# Patient Record
Sex: Male | Born: 1950 | Race: White | Hispanic: No | Marital: Married | Smoking: Former smoker
Health system: Southern US, Community
[De-identification: ages and names within clinical notes are randomized; demographics above are authoritative.]

## PROBLEM LIST (undated history)

## (undated) DIAGNOSIS — I340 Nonrheumatic mitral (valve) insufficiency: Secondary | ICD-10-CM

## (undated) DIAGNOSIS — IMO0002 Reserved for concepts with insufficient information to code with codable children: Secondary | ICD-10-CM

## (undated) DIAGNOSIS — R9430 Abnormal result of cardiovascular function study, unspecified: Secondary | ICD-10-CM

## (undated) DIAGNOSIS — I10 Essential (primary) hypertension: Secondary | ICD-10-CM

## (undated) DIAGNOSIS — R9431 Abnormal electrocardiogram [ECG] [EKG]: Secondary | ICD-10-CM

## (undated) DIAGNOSIS — R6889 Other general symptoms and signs: Secondary | ICD-10-CM

## (undated) DIAGNOSIS — C61 Malignant neoplasm of prostate: Secondary | ICD-10-CM

## (undated) DIAGNOSIS — N200 Calculus of kidney: Secondary | ICD-10-CM

## (undated) DIAGNOSIS — T8859XA Other complications of anesthesia, initial encounter: Secondary | ICD-10-CM

## (undated) HISTORY — DX: Abnormal result of cardiovascular function study, unspecified: R94.30

## (undated) HISTORY — DX: Abnormal electrocardiogram (ECG) (EKG): R94.31

## (undated) HISTORY — DX: Other general symptoms and signs: R68.89

## (undated) HISTORY — PX: PROSTATE BIOPSY: SHX241

## (undated) HISTORY — DX: Nonrheumatic mitral (valve) insufficiency: I34.0

## (undated) HISTORY — PX: BACK SURGERY: SHX140

## (undated) HISTORY — DX: Reserved for concepts with insufficient information to code with codable children: IMO0002

---

## 2001-10-04 ENCOUNTER — Ambulatory Visit (HOSPITAL_COMMUNITY): Admission: RE | Admit: 2001-10-04 | Discharge: 2001-10-04 | Payer: Self-pay | Admitting: Gastroenterology

## 2008-03-14 ENCOUNTER — Inpatient Hospital Stay (HOSPITAL_COMMUNITY): Admission: EM | Admit: 2008-03-14 | Discharge: 2008-03-15 | Payer: Self-pay | Admitting: Emergency Medicine

## 2011-04-11 NOTE — Discharge Summary (Signed)
NAME:  Brandon Goodman, Brandon Goodman              ACCOUNT NO.:  [id number]   MEDICAL RECORD NO.:  [id number]          PATIENT TYPE:  INP   LOCATION:  3007                         FACILITY:  [facility]   PHYSICIAN:  Marlan Palau, M.D.  DATE OF BIRTH:  01-Aug-1951   DATE OF ADMISSION:  03/14/2008  DATE OF DISCHARGE:  03/15/2008                               DISCHARGE SUMMARY   ADMISSION DIAGNOSIS:  Probable transient global amnesia.   DISCHARGE DIAGNOSIS:  Probable transient global amnesia.   PROCEDURE:  During admission:  1. CT of the head.  2. MRI of the brain.  3. MR angiogram.   COMPLICATION:  Above procedure is none.   HISTORY OF PRESENT ILLNESS:  Brandon Goodman is a 60 year old white male  born on 22-Aug-1951, with a history of lumbosacral spinal  stenosis.  The patient has otherwise been in very good health, works out  on a regular basis.  The patient was working in the garden on the day of  admission and has no recollection of events from around 10:00 a.m. to  about 3:00 p.m..  Wife became concerned when he did not recall the  upcoming wedding of their daughter within the next week.  The patient,  however, resolved by time.  He got to the emergency room, actually drove  to the ER.  The CT scan of the brain done was unremarkable.  The patient  was admitted with a diagnosis of probable transient global amnesia.  The  patient was fully functional during the amnestic period.   PAST MEDICAL HISTORY:  Significant for:  1. Transient episode of amnesia likely transient global amnesia.  2. History of lumbar spinal stenosis, status post surgery in 1998.   MEDICATIONS ON ADMISSION:  None.   The patient has no known allergies and does drink alcohol on occasion,  does not smoke cigarettes but used to 30 years ago.  Please refer to  history and physical, dictation summary, social history, family history,  review of systems, and physical examination.   HOSPITAL COURSE:  This patient has done  well during the course of  hospitalization.  This patient has had no memory deficits since the  admission.  Blood work was done on admission included a white count of  9.1, hemoglobin of 16.6, hematocrit of 48.0, MCV of 90.6, platelets of  147, and INR 0.9.  Sodium 141, potassium 4.0, chloride of 106, CO2 28,  glucose of 91, BUN 20, and creatinine 1.6.  Repeat creatinine 1.25,  total bilirubin 0.4, alk phosphatase 51, SGOT 20, SGPT of 16, total  protein 5.8, and albumin of 3.3.  CK-MB fraction 2.29 and troponin I of  less than 0.05.  Urine drug screen negative.  Urinalysis reveals  specific gravity of 1.025 and pH of 6.0.  The patient has been placed on  low-dose aspirin since admission.  The patient has been given fluid  hydration.  The patient was also placed on low-dose Zocor 20 mg daily.  The patient set up for an MRI scan of the brain with MRI angiogram,  which is pending at this time.  If this is unremarkable, the patient  will be discharged to home today and  will follow up with [facility] in 4-6 weeks.  If a  recurring event is noted in EEG, study will be obtained.  The patient  again is to remain on the low-dose aspirin.  At the time of discharge,  the patient is alert, cooperative, and at normal baseline.  Patient has  no focal neurologic deficits.      Marlan Palau, M.D.  Electronically Signed     CKW/MEDQ  D:  03/15/2008  T:  03/15/2008  Job:  [id number]   cc:   [facility]

## 2011-04-11 NOTE — H&P (Signed)
NAME:  Brandon Goodman, WINOGRAD NO.:  [id number]   MEDICAL RECORD NO.:  [id number]          PATIENT TYPE:  INP   LOCATION:  1823                         FACILITY:  [facility]   PHYSICIAN:  Suzzette Righter, DO        DATE OF BIRTH:  1951/02/03   DATE OF ADMISSION:  03/14/2008  DATE OF DISCHARGE:                              HISTORY & PHYSICAL   HISTORY OF PRESENT ILLNESS:  The patient is a 60 year old Caucasian male  seen in neurological assessment in the emergency department at [facility] for transient confusion.  The patient is accompanied by  his wife, who aids in providing the history.  Apparently, the patient  awoke this morning in his normal state.  He was able to eat breakfast  without any difficulty.  At approximately 9:30 in the morning, he went  to his yard to do some gardening.  He was joined by his wife at  approximately 10:30.  At that time between 10:30 and 2:30 p.m., the  patient was somewhat confused about typical events that he would be  aware of including the upcoming wedding of his daughter.  The patient  was able to continue working in the garden.  There were no physical  signs of illness or pain.  There was no loss of consciousness.  There  was no bowel or bladder incontinence.  There was no tonic-clonic  activity suggestive of a seizure.  At approximately 2:30, when his  symptoms persisted, the patient's wife became concerned.  Later that  afternoon, the patient and his wife drove to the hospital for  assessment.  The patient was able to drive the car without any  difficulty.  Upon arrival in the emergency department, he was at  baseline.  A CT scan of the brain without IV contrast was performed at  that time, which was essentially unremarkable.  The patient had routine  laboratory assessment performed as well as serial blood pressure  measurements, which were essentially unremarkable as well.   PAST MEDICAL HISTORY:  Includes lumbar spinal stenosis,  status post  lumbar surgery in 1998.  Patient also with an elevated total cholesterol  as an outpatient of 210 recently.   SOCIAL HISTORY:  The patient has a remote history of tobacco consumption  approximately 30 years ago.  He does consume 2-3 glasses of wine per  week.  He works as a Copywriter, advertising.  He is independent of activities  of daily living and does not use any assistive devices for ambulation.  He is married and resides with his wife.   FAMILY HISTORY:  The patient is with 2 healthy daughters.  His father is  deceased, status post CABG in his 53s and development of Parkinson  disease in his later age.   ALLERGIES:  No known drug allergies.   MEDICATIONS:  None.   REVIEW OF SYSTEMS:  At present time, the patient denies any numbness,  tingling, weakness, confusion, visual disturbance, chest pain, shortness  of breath, abdominal pain, nausea, or vomiting.  He is confirmed to be  back at baseline according  to his wife.   PHYSICAL EXAMINATION:  GENERAL: The patient is awake and alert.  He is  pleasant and cooperative.  He is in no acute distress and does not  appear to be toxic.  VITAL SIGNS: Temperature 98.3, blood pressure 132/61, pulse 68,  respirations 15, and pulse ox 100% on room air.  HEENT: The patient is normocephalic and atraumatic.  Oral mucosa is  moist.  There are no tongue lacerations present.  The patient's left ear  is hyperemic.  There is no discomfort with palpation, however.  Tympanic  membranes are clear bilaterally.  The patient with a raised area  posterior to his right earlobe, which he indicates is from a recent  spider bite.  It is unclear as to whether this is lymphadenopathy.  There is no tenderness or discomfort, however, associated with it.  NECK: There is no nuchal rigidity or neck tenderness.  HEART: Regular rate.  No murmurs were noted.  No carotid bruits are  noted.  LUNGS: Clear to auscultation bilaterally.  ABDOMEN: Soft, nontender,  and nondistended.  EXTREMITIES: Without any edema or cyanosis.  NEUROLOGICAL: Mental status, the patient is awake and alert.  He follows  all commands appropriately.  He does have a period of time for which he  does not have good recollection as is confirmed by his wife.  He is  currently able to state his name, year, date, location, and president.  He is able to repeat sentences.  He is able to perform simple  mathematics.  There is no dysarthria or dysphagia noted.  CRANIAL NERVES: Pupils are equally round and reactive to light  bilaterally.  Extraocular muscle movements are intact bilaterally.  Facial sensation is symmetrically intact bilaterally.  Muscles of facial  expression are intact bilaterally.  There are no gross hearing deficits.  Palate elevates symmetrically.  Sternocleidomastoid and trapezius  strength are full.  Tongue protrudes in midline without any lacerations.  MOTOR: There is no resting or action tremor.  There is no pronator  drift.  Grip, elbow flexion, elbow extension, shoulder abduction, hip  flexion, ankle plantar flexion, and ankle dorsiflexion are all 5/5  bilaterally.  REFLEXES: Biceps, triceps, and brachioradialis are 2/4 bilaterally.  Patellar reflexes are 1/4 on the left and 2/4 on the right.  Achilles  reflexes are absent bilaterally.  Babinski response is downgoing  bilaterally.  SENSORY: Vibratory sensation is diminished by approximately 10% in the  left upper extremity.  Vibratory sensation is completely intact in the  bilateral lower extremities as well as the right upper extremity.  Pin  sensation is symmetrically preserved in the bilateral upper and lower  extremities as well as the bilateral face.  CEREBELLAR: Finger-to-nose and rapid alternating movements are intact  bilaterally.  There is no dysdiadochokinesia.   RADIOLOGY DATA:  CT imaging of the brain performed in the emergency  department without IV contrast was personally reviewed and  revealed only  minimal atrophy.  There was no evidence of intracranial hemorrhage.   LABORATORY ASSESSMENT:  White blood cell count 9.1, hemoglobin 16.6,  hematocrit 48.0, and platelet count of 147.  Sodium 142, potassium 3.8,  chloride 104, BUN 20, creatinine 1.6, glucose 91, and ionized calcium  1.24.  Troponin I was unremarkable.  CK-MB was 3.4.  Myoglobin was 224.  PT 12.7 and INR 0.9.  Urinalysis is unremarkable with the exception of a  marginal specific gravity of 1.025 suggestive of dehydration.  Urine  drug screen is unremarkable.  Chest  x-ray is also unremarkable.   IMPRESSION:  1. The patient is a 60 year old Caucasian male, who was noted to have      amnesia between 10:30 a.m. and 2:30 p.m. today while gardening with      his wife.  No seizure activity or other behavioral change is noted      during this time frame.  The patient drove self to the emergency      department with wife and has been at baseline throughout the      emergency department stay.  CT assessment is unremarkable.  Labs as      well as his neurological exam are unremarkable as well.  Possible      etiologies include:      a.     Transient global amnesia.      b.     Partial seizure, unlikely given pt. age, normal CT scan, and       event being witnessed.      c.     Dehydration given creatinine of 1.6 and specific gravity of       1.025.      d.     I do not suspect stroke or transient ischemic attack, given       absence of focal symptoms.  2. History of spinal stenosis, status post lumbar surgery in 1998.  3. RT retroauricular area of induration, possibly related to spider      bite, as suggested by pt.  4. LT ear hyperemia   PLAN:  1. I had a lengthy discussion with the patient and his wife.  The      patient will be admitted to the neurology floor.  2. MRI of the brain and MRA to rule out any focal lesions.  3. Check morning lipid profile, TSH, repeat BUN and creatinine, and      check liver  panel.  4. Start aspirin 81 mg p.o. daily and Lipitor 10 mg p.o. daily in a.m.  5. Outpatient versus inpatient EEG, pending MRI results.  6. Neuro checks q.4 h.  7. Cardiac telemetry.  8. We will have Dr. Anne Hahn, my colleague, check the patient in the      morning.      Suzzette Righter, DO  Electronically Signed     RH/MEDQ  D:  03/14/2008  T:  03/14/2008  Job:  [id number]   cc:   Marlan Palau, M.D.

## 2011-08-22 LAB — LIPID PANEL
HDL: 33 — ABNORMAL LOW
Total CHOL/HDL Ratio: 5.2
Triglycerides: 101

## 2011-08-22 LAB — CBC
Hemoglobin: 13.8
Hemoglobin: 16.6
MCHC: 34.4
Platelets: 118 — ABNORMAL LOW
RBC: 5.3
RDW: 12

## 2011-08-22 LAB — HEPATIC FUNCTION PANEL
Alkaline Phosphatase: 51
Bilirubin, Direct: <0.1
Total Bilirubin: 0.4

## 2011-08-22 LAB — POCT CARDIAC MARKERS
CKMB, poc: 2.2
Myoglobin, poc: 224
Operator id: 151321
Operator id: 151321
Operator id: 196461
Troponin i, poc: <0.05

## 2011-08-22 LAB — RAPID URINE DRUG SCREEN, HOSP PERFORMED
Amphetamines: NOT DETECTED
Tetrahydrocannabinol: NOT DETECTED

## 2011-08-22 LAB — POCT I-STAT, CHEM 8
BUN: 20
Chloride: 104
HCT: 49
Sodium: 142
TCO2: 30

## 2011-08-22 LAB — CREATININE, SERUM
Creatinine, Ser: 1.25
GFR calc Af Amer: >60
GFR calc non Af Amer: 59 — ABNORMAL LOW

## 2011-08-22 LAB — URINALYSIS, ROUTINE W REFLEX MICROSCOPIC
Bilirubin Urine: NEGATIVE
Hgb urine dipstick: NEGATIVE
Ketones, ur: NEGATIVE
Protein, ur: NEGATIVE
Urobilinogen, UA: 0.2

## 2011-08-22 LAB — DIFFERENTIAL
Basophils Absolute: 0
Lymphocytes Relative: 16
Monocytes Absolute: 0.7
Monocytes Relative: 8
Neutro Abs: 6.9

## 2011-08-22 LAB — PROTIME-INR
INR: 0.9
Prothrombin Time: 12.7

## 2011-08-22 LAB — BUN: BUN: 19

## 2011-08-22 LAB — ELECTROLYTE PANEL
Potassium: 4
Sodium: 141

## 2012-02-07 ENCOUNTER — Other Ambulatory Visit: Payer: Self-pay | Admitting: Internal Medicine

## 2012-02-07 ENCOUNTER — Ambulatory Visit
Admission: RE | Admit: 2012-02-07 | Discharge: 2012-02-07 | Disposition: A | Discharge status: Home / Self Care | Payer: No Typology Code available for payment source | Source: Ambulatory Visit | Attending: Internal Medicine | Admitting: Internal Medicine

## 2012-02-07 DIAGNOSIS — R509 Fever, unspecified: Secondary | ICD-10-CM

## 2012-08-21 ENCOUNTER — Other Ambulatory Visit: Payer: Self-pay

## 2012-08-21 DIAGNOSIS — R9431 Abnormal electrocardiogram [ECG] [EKG]: Secondary | ICD-10-CM

## 2012-08-23 ENCOUNTER — Ambulatory Visit (HOSPITAL_COMMUNITY): Payer: No Typology Code available for payment source | Attending: Internal Medicine | Admitting: Radiology

## 2012-08-23 DIAGNOSIS — I059 Rheumatic mitral valve disease, unspecified: Secondary | ICD-10-CM | POA: Insufficient documentation

## 2012-08-23 DIAGNOSIS — I379 Nonrheumatic pulmonary valve disorder, unspecified: Secondary | ICD-10-CM | POA: Insufficient documentation

## 2012-08-23 DIAGNOSIS — I079 Rheumatic tricuspid valve disease, unspecified: Secondary | ICD-10-CM | POA: Insufficient documentation

## 2012-08-23 DIAGNOSIS — R9431 Abnormal electrocardiogram [ECG] [EKG]: Secondary | ICD-10-CM | POA: Insufficient documentation

## 2012-08-23 NOTE — Progress Notes (Signed)
Echocardiogram performed.  

## 2012-08-26 ENCOUNTER — Other Ambulatory Visit: Payer: Self-pay

## 2012-08-26 ENCOUNTER — Encounter: Payer: Self-pay | Admitting: Cardiology

## 2012-08-26 DIAGNOSIS — R9431 Abnormal electrocardiogram [ECG] [EKG]: Secondary | ICD-10-CM

## 2012-08-26 NOTE — Progress Notes (Signed)
   The patient is followed by Dr. Elmore Guise. Dr. Chilton Si called me recently to explain that the patient had new EKG changes with some ST-T wave changes. He explained that the patient is quite active. However he was concerned about the changes. Two-dimensional echo was done showing that the patient has good left ventricular function. This argues against any prior significant myocardial injury. With EKG changes, Dr. Chilton Si would like to proceed with a full nuclear exercise test. I feel that this is the appropriate test for him to have at this time. We will schedule this.  Jerral Bonito, MD

## 2012-09-03 ENCOUNTER — Encounter (HOSPITAL_COMMUNITY): Payer: No Typology Code available for payment source

## 2012-09-03 ENCOUNTER — Encounter: Payer: Self-pay | Admitting: Cardiology

## 2012-09-03 DIAGNOSIS — R9430 Abnormal result of cardiovascular function study, unspecified: Secondary | ICD-10-CM | POA: Insufficient documentation

## 2012-09-03 DIAGNOSIS — R9431 Abnormal electrocardiogram [ECG] [EKG]: Secondary | ICD-10-CM | POA: Insufficient documentation

## 2012-09-03 DIAGNOSIS — I340 Nonrheumatic mitral (valve) insufficiency: Secondary | ICD-10-CM | POA: Insufficient documentation

## 2012-09-06 ENCOUNTER — Ambulatory Visit (INDEPENDENT_AMBULATORY_CARE_PROVIDER_SITE_OTHER): Payer: No Typology Code available for payment source | Admitting: Cardiology

## 2012-09-06 ENCOUNTER — Encounter: Payer: Self-pay | Admitting: Cardiology

## 2012-09-06 ENCOUNTER — Encounter: Payer: Self-pay | Admitting: *Deleted

## 2012-09-06 VITALS — BP 178/102 | HR 58 | Ht 72.0 in | Wt 201.0 lb

## 2012-09-06 DIAGNOSIS — R9431 Abnormal electrocardiogram [ECG] [EKG]: Secondary | ICD-10-CM

## 2012-09-06 DIAGNOSIS — R6889 Other general symptoms and signs: Secondary | ICD-10-CM

## 2012-09-06 DIAGNOSIS — I059 Rheumatic mitral valve disease, unspecified: Secondary | ICD-10-CM

## 2012-09-06 DIAGNOSIS — I340 Nonrheumatic mitral (valve) insufficiency: Secondary | ICD-10-CM

## 2012-09-06 NOTE — Patient Instructions (Addendum)
Your physician recommends that you schedule a follow-up appointment based on results. You will receive a call from Dr. Myrtis Ser. Your physician has requested that you have en exercise stress myoview. For further information please visit [email or web address] Please follow instruction sheet, as given.

## 2012-09-06 NOTE — Assessment & Plan Note (Addendum)
The patient's EKG is definitely abnormal. We know that he has normal LV function by echo. Standard exercise testing in his case will not be appropriate with this underlying EKG. We need to proceed with the most sensitive study to assess him further. This will be a stress Myoview scan. We will look into scheduling this. I will then be in touch with him with the information.  I will also be in touch with Dr. Chilton Si to obtain the specifics of the prior lipid studies. The patient has not required lipid therapy in the past.  I do agree that the patient should take 81 mg of aspirin daily for now.

## 2012-09-06 NOTE — Assessment & Plan Note (Signed)
Blood pressure is elevated today. On other occasions it has not been elevated. He has a home blood pressure cuff. He will check his pressure intermittently for a while and then be in touch with [place].

## 2012-09-06 NOTE — Assessment & Plan Note (Signed)
There is very mild mitral regurgitation by echo September, 2013. This is not a significant finding. This can be followed up in the future.

## 2012-09-06 NOTE — Progress Notes (Signed)
Patient ID: Brandon Goodman, male   DOB: March 25, 1951, 61 y.o.   MRN: [id number]   HPI  Patient is seen today for cardiac evaluation. He is a very active gentleman. He had an EKG that is abnormal. Initially a 2-D echo was done to be sure that LV function is normal. His ejection fraction is normal. There are no focal wall motion abnormalities. There is mild mitral regurgitation. The patient exercises regularly. His father had bypass surgery in his 18s. He has a brother with no heart disease. The patient does not smoke. There is no diabetes. Historically there is no hypertension. In general his blood pressure has been normal in doctors offices. His pressure here today is high. His cholesterol has been in the normal range in the past but I do not have specifics of his LDL and HDL.  EKG is done today. It is definitely abnormal. He has diffuse T-wave inversions in leads V3 V4 and V5. Anterior ischemia cannot be ruled out.  No Known Allergies  No current outpatient prescriptions on file.    History   Social History  . Marital Status: Married    Spouse Name: N/A    Number of Children: N/A  . Years of Education: N/A   Occupational History  . Not on file.   Social History Main Topics  . Smoking status: Former Smoker -- 0.3 packs/day for 5 years    Types: Cigarettes    Quit date: 11/27/1974  . Smokeless tobacco: Never Used  . Alcohol Use: Not on file  . Drug Use: Not on file  . Sexually Active: Not on file   Other Topics Concern  . Not on file   Social History Narrative  . No narrative on file    No family history on file.  Past Medical History  Diagnosis Date  . Abnormal EKG     September, 2013  . Ejection fraction     EF 60-65%, echo, August 23, 2012  . Mitral regurgitation     Mild, echo, September, 2013    No past surgical history on file.  Patient Active Problem List  Diagnosis  . Abnormal EKG  . Ejection fraction  . Mitral regurgitation    ROS   Patient denies  fever, chills, headache, sweats, rash, change in vision, change in hearing, chest pain, cough, nausea vomiting, urinary symptoms. All other systems are reviewed and are negative.  PHYSICAL EXAM   Patient appears quite healthy. He is oriented to person time and place. Affect is normal. There are no carotid bruits. There is no jugulovenous distention. Lungs are clear. Respiratory effort is nonlabored. Cardiac exam reveals S1 and S2. There no clicks or significant murmurs. The abdomen is soft. There is no peripheral edema. There no musculoskeletal deformities. There are no skin rashes.  Filed Vitals:   09/06/12 1231 09/06/12 1239  BP: 162/102 178/102  Pulse: 62 58  Height: 6' (1.829 m)   Weight: 201 lb (91.173 kg)    EKG is done today and reviewed by me. There is sinus rhythm area there is mild diffuse ST depression. There is significant inversion of T waves in leads V3 V4 and V5. There is mild inversion of other T waves.  ASSESSMENT & PLAN

## 2012-09-16 ENCOUNTER — Encounter (HOSPITAL_COMMUNITY): Payer: No Typology Code available for payment source

## 2012-09-19 ENCOUNTER — Ambulatory Visit (HOSPITAL_COMMUNITY): Payer: No Typology Code available for payment source | Attending: Cardiovascular Disease | Admitting: Radiology

## 2012-09-19 VITALS — BP 171/95 | Ht 72.0 in | Wt 201.0 lb

## 2012-09-19 DIAGNOSIS — I2510 Atherosclerotic heart disease of native coronary artery without angina pectoris: Secondary | ICD-10-CM

## 2012-09-19 DIAGNOSIS — Z8249 Family history of ischemic heart disease and other diseases of the circulatory system: Secondary | ICD-10-CM | POA: Insufficient documentation

## 2012-09-19 DIAGNOSIS — I4949 Other premature depolarization: Secondary | ICD-10-CM

## 2012-09-19 DIAGNOSIS — R9431 Abnormal electrocardiogram [ECG] [EKG]: Secondary | ICD-10-CM | POA: Insufficient documentation

## 2012-09-19 MED ORDER — TECHNETIUM TC 99M SESTAMIBI GENERIC - CARDIOLITE
33.0000 | Freq: Once | INTRAVENOUS | Status: AC | PRN
  Administered 2012-09-19: 33 via INTRAVENOUS

## 2012-09-19 MED ORDER — TECHNETIUM TC 99M SESTAMIBI GENERIC - CARDIOLITE
11.0000 | Freq: Once | INTRAVENOUS | Status: AC | PRN
  Administered 2012-09-19: 11 via INTRAVENOUS

## 2012-09-19 MED ORDER — REGADENOSON 0.4 MG/5ML IV SOLN
0.4000 mg | Freq: Once | INTRAVENOUS | Status: AC
  Administered 2012-09-19: 0.4 mg via INTRAVENOUS

## 2012-09-19 NOTE — Progress Notes (Signed)
[facility] SITE 3 NUCLEAR MED [id number] [place] [address] [phone]  Cardiology Nuclear Med Study  Brandon Goodman is a 61 y.o. male     MRN : [id number]     DOB: 1951/02/07  Procedure Date: 09/19/2012  Nuclear Med Background Indication for Stress Test:  Evaluation for Ischemia and Abnormal EKG History:  '90 GXT: normal per patient, 08-23-12 Echo: EF=60-65%, mild MR Cardiac Risk Factors: Family History - CAD and History of Smoking  Symptoms:Patient denies chest pain or SOB.   Nuclear Pre-Procedure Caffeine/Decaff Intake:  None > 12 hrs NPO After: 8:00pm   Lungs:  clear O2 Sat: 98% on room air. IV 0.9% NS with Angio Cath:  20g  IV Site: R Antecubital x 1, tolerated well IV Started by:  Irean Hong, RN  Chest Size (in):  40 Cup Size: n/a  Height: 6' (1.829 m)  Weight:  201 lb (91.173 kg)  BMI:  Body mass index is 27.26 kg/(m^2). Tech Comments:  Contacted patient, after he left today, and instructed him to continue monitoring his BP, and contact Dr. Myrtis Ser' office in [place] with results. W.Deal,RT-N    Nuclear Med Study 1 or 2 day study: 1 day  Stress Test Type:  Stress  Reading MD: Kristeen Miss, MD  Order Authorizing Provider:  Willa Rough, MD  Resting Radionuclide: Technetium 53m Sestamibi  Resting Radionuclide Dose: 11.0 mCi   Stress Radionuclide:  Technetium 26m Sestamibi  Stress Radionuclide Dose: 33.0 mCi           Stress Protocol Rest HR: 52 Stress HR: 100  Rest BP: 171/95 Stress BP: 202/112  Exercise Time (min): 7:15 METS: 6.9   Predicted Max HR: 158 bpm % Max HR: 63.29 bpm Rate Pressure Product: 24401   Dose of Adenosine (mg):  n/a Dose of Lexiscan: 0.4 mg  Dose of Atropine (mg): n/a Dose of Dobutamine: n/a mcg/kg/min (at max HR)  Stress Test Technologist: Irean Hong, RN  Nuclear Technologist:  Domenic Polite, CNMT     Rest Procedure:  Myocardial perfusion imaging was performed at rest 45 minutes following the  intravenous administration of Technetium 53m Sestamibi. Rest ECG: Sinus Bradycardia with ST changes with marked T wave inversion  Stress Procedure:  The patient attempted to walk the treadmill utilizing the Bruce Protocol for 5 minutes , but was unable to reach target heart rate due to marked hypertensive response. The patient has no history of hypertension. The patient received IV Lexiscan 0.4 mg over 15-seconds with concurrent low level exercise and then Technetium 45m Sestamibi was injected at 30-seconds while the patient continued walking one more minute. The EKG was non diagnostic due to baseline changes.   There were frequent PVC's, and bigeminy.  Quantitative spect images were obtained after a 45-minute delay. Stress ECG: No significant change from baseline ECG  QPS Raw Data Images:  Normal; no motion artifact; normal heart/lung ratio. Stress Images:  Normal homogeneous uptake in all areas of the myocardium. Rest Images:  Normal homogeneous uptake in all areas of the myocardium. Subtraction (SDS):  No evidence of ischemia. Transient Ischemic Dilatation (Normal <1.22):  1.06 Lung/Heart Ratio (Normal <0.45):  0.28  Quantitative Gated Spect Images QGS EDV:  114 ml QGS ESV:  46 ml  Impression Exercise Capacity:  Lexiscan with low level exercise. BP Response:  Normal blood pressure response. Clinical Symptoms:  No significant symptoms noted. ECG Impression:  No significant ST segment change suggestive of ischemia. Comparison with Prior Nuclear Study:  No previous nuclear study performed  Overall Impression:  Normal stress nuclear study.  No evidence of ischemia.  Normal LV function.   LV Ejection Fraction: 59%.  LV Wall Motion:  NL LV Function; NL Wall Motion.    Vesta Mixer, Montez Hageman., MD, [facility] 09/19/2012, 6:27 PM Office - [phone] Pager [phone]

## 2012-09-20 ENCOUNTER — Telehealth: Payer: Self-pay | Admitting: Cardiology

## 2012-09-20 NOTE — Telephone Encounter (Signed)
Patient called and states BP was high during stress test and test was stopped due to BP being elevated.  Patient last night at 6:45pm 172/98 and then 154/90 and 127/85 at 9:15pm 132/72 115/75 122/76 10:00am this morning 153/84 then  111/75 then 134/67.  Then patient states today 3:50pm 111/60.  Patient states he was told to contact us regarding his BP after the stress test.   Patient only takes an 81mg  aspirin daily, which was started about two weeks ago.  Please advise patient on his BP.  Patient at this time does not have an appointment scheduled and is awaiting stress test results on when to come back, states Dr. Myrtis Ser was going to let him know.

## 2012-09-23 NOTE — Telephone Encounter (Addendum)
Message will be forwarded to MD for review and copy placed in basket for review while in office tomorrow. Patient informed that we will call him back tomorrow.

## 2012-09-24 NOTE — Telephone Encounter (Signed)
I have spoken with the patient about his stress test and his blood pressure. He will continue to record some additional blood pressures at home. He will watch his salt intake. I will rereview with him over the next several days to a week or 2

## 2012-10-13 ENCOUNTER — Emergency Department (HOSPITAL_COMMUNITY)
Admission: EM | Admit: 2012-10-13 | Discharge: 2012-10-13 | Disposition: A | Discharge status: Home / Self Care | Payer: No Typology Code available for payment source | Attending: Emergency Medicine | Admitting: Emergency Medicine

## 2012-10-13 ENCOUNTER — Emergency Department (HOSPITAL_COMMUNITY): Payer: No Typology Code available for payment source

## 2012-10-13 ENCOUNTER — Encounter (HOSPITAL_COMMUNITY): Payer: Self-pay | Admitting: *Deleted

## 2012-10-13 DIAGNOSIS — S62502B Fracture of unspecified phalanx of left thumb, initial encounter for open fracture: Secondary | ICD-10-CM

## 2012-10-13 DIAGNOSIS — Y929 Unspecified place or not applicable: Secondary | ICD-10-CM | POA: Insufficient documentation

## 2012-10-13 DIAGNOSIS — W230XXA Caught, crushed, jammed, or pinched between moving objects, initial encounter: Secondary | ICD-10-CM | POA: Insufficient documentation

## 2012-10-13 DIAGNOSIS — Z79899 Other long term (current) drug therapy: Secondary | ICD-10-CM | POA: Insufficient documentation

## 2012-10-13 DIAGNOSIS — S62609B Fracture of unspecified phalanx of unspecified finger, initial encounter for open fracture: Secondary | ICD-10-CM | POA: Insufficient documentation

## 2012-10-13 DIAGNOSIS — Z7982 Long term (current) use of aspirin: Secondary | ICD-10-CM | POA: Insufficient documentation

## 2012-10-13 DIAGNOSIS — Y9389 Activity, other specified: Secondary | ICD-10-CM | POA: Insufficient documentation

## 2012-10-13 DIAGNOSIS — Z8679 Personal history of other diseases of the circulatory system: Secondary | ICD-10-CM | POA: Insufficient documentation

## 2012-10-13 DIAGNOSIS — Z87891 Personal history of nicotine dependence: Secondary | ICD-10-CM | POA: Insufficient documentation

## 2012-10-13 MED ORDER — CEPHALEXIN 500 MG PO CAPS: 500.0000 mg | ORAL_CAPSULE | Freq: Two times a day (BID) | ORAL | Status: DC

## 2012-10-13 MED ORDER — HYDROCODONE-ACETAMINOPHEN 5-325 MG PO TABS: 1.0000 | ORAL_TABLET | ORAL | Status: DC | PRN

## 2012-10-13 NOTE — ED Notes (Signed)
[facility] [place] at bedside to suture pt.  

## 2012-10-13 NOTE — ED Notes (Signed)
[facility] [place] at bedside to suture pt.

## 2012-10-13 NOTE — ED Notes (Signed)
Pt injured left thumb on piece of metal. Pt has skin abrasion to left hand. Bleeding controlled. CMS intact.

## 2012-10-13 NOTE — ED Notes (Signed)
Dressing applied to L thumb with bacitracin, Telfa gauze, kerlix and coban. Finger splint applied per order.

## 2012-10-13 NOTE — ED Provider Notes (Signed)
History     CSN: [id number]  Arrival date & time 10/13/12  1745   First MD Initiated Contact with Patient 10/13/12 1954      Chief Complaint  Patient presents with  . Extremity Laceration    (Consider location/radiation/quality/duration/timing/severity/associated sxs/prior treatment) HPI Comments: Patient reports he accidentally cut his left dorsal thumb on a outdoor grill door today.  States he was pulling a hose through quickly and his thumb caught on the door.  Pain is 3/10 intensity, throbbing in nature.  No radiation of pain. Denies weakness or numbness of the thumb, reports he is able to bend thumb without difficulty.  Denies other injury.  Pt is right handed.    The history is provided by the patient.    Past Medical History  Diagnosis Date  . Abnormal EKG     September, 2013  . Ejection fraction     EF 60-65%, echo, August 23, 2012  . Mitral regurgitation     Mild, echo, September, 2013  . Blood pressure alteration     Blood pressure high September 06, 2012, no prior history of hypertension    History reviewed. No pertinent past surgical history.  History reviewed. No pertinent family history.  History  Substance Use Topics  . Smoking status: Former Smoker -- 0.3 packs/day for 5 years    Types: Cigarettes    Quit date: 11/27/1974  . Smokeless tobacco: Never Used  . Alcohol Use: Yes      Review of Systems  Skin: Positive for wound. Negative for color change, pallor and rash.  Neurological: Negative for syncope, weakness and numbness.    Allergies  Review of patient's allergies indicates no known allergies.  Home Medications   Current Outpatient Rx  Name  Route  Sig  Dispense  Refill  . ASPIRIN 81 MG PO TABS   Oral   Take 81 mg by mouth daily.         . MULTI-VITAMIN/MINERALS PO TABS   Oral   Take 1 tablet by mouth daily.           BP 152/82  Pulse 57  Temp 98.6 F (37 C) (Oral)  Resp 18  SpO2 100%  Physical Exam  Nursing note  and vitals reviewed. Constitutional: He appears well-developed and well-nourished. No distress.  HENT:  Head: Normocephalic and atraumatic.  Neck: Neck supple.  Pulmonary/Chest: Effort normal.  Neurological: He is alert.  Skin: He is not diaphoretic.       Semicircle laceration of left thumb over IP joint, dorsal aspect.  Distal sensation intact, capillary refill < 2 seconds.  Full AROM of IP joint.      ED Course  Procedures (including critical care time)  Labs Reviewed - No data to display Dg Finger Thumb Left  10/13/2012  *RADIOLOGY REPORT*  Clinical Data: Left thumb pain status post injury  LEFT THUMB 2+V  Comparison: None.  Findings: Interphalangeal joint degenerative changes. There is a small calcific density along the dorsal aspect of the base of the distal phalanx.  Correlate clinically for acute avulsion injury. No dislocation.  No aggressive osseous lesion.  IMPRESSION: Interphalangeal joint degenerative changes.  Small calcific density along the dorsal aspect of the base of the distal phalanx of the is nonspecific.  Correlate with point tenderness.  May reflect a small avulsed fragment.   Original Report Authenticated By: Jearld Lesch, M.D.     I spoke with Dr Merlyn Lot about the patient.    LACERATION REPAIR  Performed by: Rise Patience Authorized by: Trixie Dredge B Consent: Verbal consent obtained. Risks and benefits: risks, benefits and alternatives were discussed Consent given by: patient Patient identity confirmed: provided demographic data Prepped and Draped in normal sterile fashion Wound explored  Laceration Location: left dorsal thumb  Laceration Length: 3cm  No Foreign Bodies seen or palpated, thoroughly explored.  Wound is deep.  However, unable to visualize extensor tendon.    Anesthesia: digital block and local infiltration  Local anesthetic: lidocaine 2% no epinephrine  Anesthetic total: 8 ml  Irrigation method: syringe Amount of cleaning:  extensive  Skin closure: 5-0 prolene  Number of sutures: 12  Technique: simple interrupted  Patient tolerance: Patient tolerated the procedure well with no immediate complications. Pt did have vasovagal response during procedure, recovered well with laying back and resting.  Pt reports he has a history of vagal response and occasionally syncope when he sees his own blood.     1. Open fracture of left thumb     MDM  Patient with accidental laceration to left thumb, found to have possible underlying avulsion fracture.  Pt with full AROM and neurovascularly intact. Discussed with Dr Merlyn Lot.  Repaired in ED.  Pt d/c home with keflex, follow up tomorrow with Dr Merlyn Lot.  Discussed all results, treatment plan, and follow up with patient.  Pt given return precautions.  Pt verbalizes understanding and agrees with plan.           [place], [place] 10/14/12 [phone]

## 2012-10-14 NOTE — ED Provider Notes (Signed)
Medical screening examination/treatment/procedure(s) were performed by non-physician practitioner and as supervising physician I was immediately available for consultation/collaboration.  Juliet Rude. Rubin Payor, MD 10/14/12 2031

## 2012-10-17 ENCOUNTER — Encounter: Payer: Self-pay | Admitting: Cardiology

## 2012-10-17 NOTE — Progress Notes (Signed)
   The patient had been seen for the evaluation of an abnormal EKG. He had normal left ventricular function by echo. A stress nuclear scan was arranged.  The patient began exercising on the treadmill and had marked hypertensive response. Because of this,  the procedure was changed to a [place] pharmacologic study with low-level exercise. The results of the study was normal. There was no sign of scar or ischemia.  After this procedure I was in contact with the patient and asked him to begin recording his blood pressures regularly at home. He has been doing this. He will be taking this information to Dr. Chilton Si, his primary physician.  I have reviewed all of this information very carefully. In addition I have reviewed the data with another one of my colleagues for additional input. We both certainly agree that the EKG changes are concerning. However, it is felt that no further workup is indicated at this time. Based on clinical judgment, it seems most likely that the EKG changes are related to hypertension in some way. Despite the fact that he may be recording pressures in the normal range at times at home,  I am inclined to recommend the addition of a medication for hypertension. There is no data in the literature suggesting that one type of medication would be better than another in this setting. It would be best to choose the medication that he tolerates the best.  Because of the EKG changes,  I will also recommend very aggressive primary prevention in his case. He does exercise regularly. His weight is under good control. He does not smoke. Therefore it  would be optimal to push for very careful blood pressure control and aggressive lipid control.    If the patient were to develop symptoms concerning for the possibility of ischemia, I would recommend aggressive evaluation with catheterization.    Jerral Bonito, MD

## 2013-05-28 ENCOUNTER — Other Ambulatory Visit: Payer: Self-pay | Admitting: Internal Medicine

## 2013-05-28 ENCOUNTER — Ambulatory Visit
Admission: RE | Admit: 2013-05-28 | Discharge: 2013-05-28 | Disposition: A | Discharge status: Home / Self Care | Payer: BC Managed Care – PPO | Source: Ambulatory Visit | Attending: Internal Medicine | Admitting: Internal Medicine

## 2015-03-31 ENCOUNTER — Ambulatory Visit
Admission: RE | Admit: 2015-03-31 | Discharge: 2015-03-31 | Disposition: A | Discharge status: Home / Self Care | Payer: Medicare Other | Source: Ambulatory Visit | Attending: Internal Medicine | Admitting: Internal Medicine

## 2015-03-31 ENCOUNTER — Other Ambulatory Visit: Payer: Self-pay | Admitting: Internal Medicine

## 2015-03-31 DIAGNOSIS — R059 Cough, unspecified: Secondary | ICD-10-CM

## 2015-03-31 DIAGNOSIS — R05 Cough: Secondary | ICD-10-CM

## 2015-05-07 ENCOUNTER — Other Ambulatory Visit: Payer: Self-pay | Admitting: Internal Medicine

## 2015-05-07 ENCOUNTER — Ambulatory Visit
Admission: RE | Admit: 2015-05-07 | Discharge: 2015-05-07 | Disposition: A | Discharge status: Home / Self Care | Payer: Medicare Other | Source: Ambulatory Visit | Attending: Internal Medicine | Admitting: Internal Medicine

## 2015-05-07 DIAGNOSIS — Z09 Encounter for follow-up examination after completed treatment for conditions other than malignant neoplasm: Secondary | ICD-10-CM

## 2015-07-15 ENCOUNTER — Ambulatory Visit
Admission: RE | Admit: 2015-07-15 | Discharge: 2015-07-15 | Disposition: A | Discharge status: Home / Self Care | Payer: Medicare Other | Source: Ambulatory Visit | Attending: Internal Medicine | Admitting: Internal Medicine

## 2015-07-15 ENCOUNTER — Other Ambulatory Visit: Payer: Self-pay | Admitting: Internal Medicine

## 2015-07-15 DIAGNOSIS — Z09 Encounter for follow-up examination after completed treatment for conditions other than malignant neoplasm: Secondary | ICD-10-CM

## 2016-11-05 ENCOUNTER — Encounter (HOSPITAL_COMMUNITY): Payer: Self-pay | Admitting: Emergency Medicine

## 2016-11-05 ENCOUNTER — Emergency Department (HOSPITAL_COMMUNITY)
Admission: EM | Admit: 2016-11-05 | Discharge: 2016-11-05 | Disposition: A | Discharge status: Home / Self Care | Payer: Medicare Other | Attending: Emergency Medicine | Admitting: Emergency Medicine

## 2016-11-05 DIAGNOSIS — Z87891 Personal history of nicotine dependence: Secondary | ICD-10-CM | POA: Insufficient documentation

## 2016-11-05 DIAGNOSIS — M545 Low back pain, unspecified: Secondary | ICD-10-CM

## 2016-11-05 DIAGNOSIS — Z7982 Long term (current) use of aspirin: Secondary | ICD-10-CM | POA: Insufficient documentation

## 2016-11-05 DIAGNOSIS — Z79899 Other long term (current) drug therapy: Secondary | ICD-10-CM | POA: Insufficient documentation

## 2016-11-05 HISTORY — DX: Calculus of kidney: N20.0

## 2016-11-05 LAB — URINALYSIS, ROUTINE W REFLEX MICROSCOPIC
Bacteria, UA: NONE SEEN
Bilirubin Urine: NEGATIVE
GLUCOSE, UA: NEGATIVE mg/dL
Ketones, ur: NEGATIVE mg/dL
Leukocytes, UA: NEGATIVE
NITRITE: NEGATIVE
PH: 5 (ref 5.0–8.0)
PROTEIN: NEGATIVE mg/dL
Specific Gravity, Urine: 1.027 (ref 1.005–1.030)
Squamous Epithelial / LPF: NONE SEEN

## 2016-11-05 MED ORDER — OXYCODONE-ACETAMINOPHEN 5-325 MG PO TABS: 1.0000 | ORAL_TABLET | Freq: Four times a day (QID) | ORAL | 0 refills | Status: DC | PRN

## 2016-11-05 MED ORDER — HYDROMORPHONE HCL 1 MG/ML IJ SOLN
1.0000 mg | Freq: Once | INTRAMUSCULAR | Status: AC
  Administered 2016-11-05: 1 mg via INTRAVENOUS
  Filled 2016-11-05: qty 1

## 2016-11-05 MED ORDER — DIAZEPAM 5 MG PO TABS: 5.0000 mg | ORAL_TABLET | Freq: Two times a day (BID) | ORAL | 0 refills | Status: DC

## 2016-11-05 MED ORDER — ONDANSETRON HCL 4 MG/2ML IJ SOLN
4.0000 mg | Freq: Once | INTRAMUSCULAR | Status: AC
  Administered 2016-11-05: 4 mg via INTRAVENOUS
  Filled 2016-11-05: qty 2

## 2016-11-05 MED ORDER — NAPROXEN 500 MG PO TABS: 500.0000 mg | ORAL_TABLET | Freq: Two times a day (BID) | ORAL | 0 refills | Status: DC

## 2016-11-05 MED ORDER — KETOROLAC TROMETHAMINE 30 MG/ML IJ SOLN
30.0000 mg | Freq: Once | INTRAMUSCULAR | Status: AC
  Administered 2016-11-05: 30 mg via INTRAVENOUS
  Filled 2016-11-05: qty 1

## 2016-11-05 NOTE — ED Notes (Signed)
ED Provider at bedside. 

## 2016-11-05 NOTE — ED Provider Notes (Signed)
[facility] DEPT Provider Note   CSN: [id number] Arrival date & time: 11/05/16  1647     History   Chief Complaint Chief Complaint  Patient presents with  . Back Pain    HPI Brandon Goodman is a 65 y.o. male.  HPI  Pt presenting with right sided low back pain.  Pt states he felt some mild pain last night, but when he awoke this morning he had worse pain.  Pain is much worse with movement.  He feels very stiff in his back.  No weakness of legs,  No difficulty urinating, no incontinence of bowel or bladder.  No fever/chills.  No new activities or injuries.  He tried aspirin and one oxycodone today at 1pm without any relief.  He states he had lumbar spinal surgery approx 20 years ago and since then has not had any back problems.  Pain is constant and sharp.  There are no other associated systemic symptoms, there are no other alleviating or modifying factors.   Past Medical History:  Diagnosis Date  . Abnormal EKG    September, 2013  . Blood pressure alteration    Blood pressure high September 06, 2012, no prior history of hypertension  . Ejection fraction    EF 60-65%, echo, August 23, 2012  . Kidney stone   . Mitral regurgitation    Mild, echo, September, 2013    Patient Active Problem List   Diagnosis Date Noted  . Blood pressure alteration   . Abnormal EKG   . Ejection fraction   . Mitral regurgitation     History reviewed. No pertinent surgical history.     Home Medications    Prior to Admission medications   Medication Sig Start Date End Date Taking? Authorizing Provider  aspirin 81 MG tablet Take 81 mg by mouth daily.    Historical Provider, MD  cephALEXin (KEFLEX) 500 MG capsule Take 1 capsule (500 mg total) by mouth 2 (two) times daily. 10/13/12   Clayton Bibles, PA-C  diazepam (VALIUM) 5 MG tablet Take 1 tablet (5 mg total) by mouth 2 (two) times daily. 11/05/16   Alfonzo Beers, MD  HYDROcodone-acetaminophen (NORCO/VICODIN) 5-325 MG per tablet Take 1 tablet  by mouth every 4 (four) hours as needed for pain. 10/13/12   Clayton Bibles, PA-C  Multiple Vitamins-Minerals (MULTIVITAMIN WITH MINERALS) tablet Take 1 tablet by mouth daily.    Historical Provider, MD  naproxen (NAPROSYN) 500 MG tablet Take 1 tablet (500 mg total) by mouth 2 (two) times daily. 11/05/16   Alfonzo Beers, MD  oxyCODONE-acetaminophen (PERCOCET/ROXICET) 5-325 MG tablet Take 1-2 tablets by mouth every 6 (six) hours as needed for severe pain. 11/05/16   Alfonzo Beers, MD    Family History History reviewed. No pertinent family history.  Social History Social History  Substance Use Topics  . Smoking status: Former Smoker    Packs/day: 0.30    Years: 5.00    Types: Cigarettes    Quit date: 11/27/1974  . Smokeless tobacco: Never Used  . Alcohol use Yes     Allergies   Patient has no known allergies.   Review of Systems Review of Systems  ROS reviewed and all otherwise negative except for mentioned in HPI   Physical Exam Updated Vital Signs BP 165/96   Pulse (!) 55   Temp 98 F (36.7 C) (Oral)   Resp 20   SpO2 94%  Vitals reviewed Physical Exam Physical Examination: General appearance - alert, well appearing, and in no distress  Mental status - alert, oriented to person, place, and time Eyes - no conjunctival injection, no scleral icterus Neck - nontender to palpation Chest - clear to auscultation, no wheezes, rales or rhonchi, symmetric air entry Heart - normal rate, regular rhythm, normal S1, S2, no murmurs, rubs, clicks or gallops Abdomen - soft, nondistended Back exam - no midline tenderness to palpation, tenderness over right lumbar paraspinal muscles, no CVA tenderness, midlin well healed scar over lumbar region Neurological - alert, oriented, normal speech,strength 5/5 in extremities, sensation intact, halting gait but limited only due to pain Extremities - peripheral pulses normal, no pedal edema, no clubbing or cyanosis Skin - normal coloration and turgor,  no rashes  ED Treatments / Results  Labs (all labs ordered are listed, but only abnormal results are displayed) Labs Reviewed  URINALYSIS, ROUTINE W REFLEX MICROSCOPIC - Abnormal; Notable for the following:       Result Value   Hgb urine dipstick SMALL (*)    All other components within normal limits    EKG  EKG Interpretation None       Radiology No results found.  Procedures Procedures (including critical care time)  Medications Ordered in ED Medications  ketorolac (TORADOL) 30 MG/ML injection 30 mg (30 mg Intravenous Given 11/05/16 1831)  HYDROmorphone (DILAUDID) injection 1 mg (1 mg Intravenous Given 11/05/16 1831)  ondansetron (ZOFRAN) injection 4 mg (4 mg Intravenous Given 11/05/16 1831)     Initial Impression / Assessment and Plan / ED Course  I have reviewed the triage vital signs and the nursing notes.  Pertinent labs & imaging results that were available during my care of the patient were reviewed by me and considered in my medical decision making (see chart for details).  Clinical Course   8:08 PM on recheck patient is feeling much improved.  He is lying comfortably on the stretcher.   Pt presenting with right sided low back pain.  No signs or symptoms of cauda equina, no fever to suggest epidural abscess.  Pt feels much improved after meds in the ED.  He is able to move much more comfortably.  Will discharge with prescriptions for pain meds, muscle relaxer, anti-inflammatory.  D/w patient that if pain continues or worsens he may need MRI imaging on an outpatient basis.  Discharged with strict return precautions.  Pt agreeable with plan.  Final Clinical Impressions(s) / ED Diagnoses   Final diagnoses:  Acute right-sided low back pain without sciatica    New Prescriptions Discharge Medication List as of 11/05/2016  8:12 PM    START taking these medications   Details  diazepam (VALIUM) 5 MG tablet Take 1 tablet (5 mg total) by mouth 2 (two) times daily.,  Starting Sun 11/05/2016, Print    naproxen (NAPROSYN) 500 MG tablet Take 1 tablet (500 mg total) by mouth 2 (two) times daily., Starting Sun 11/05/2016, Print    oxyCODONE-acetaminophen (PERCOCET/ROXICET) 5-325 MG tablet Take 1-2 tablets by mouth every 6 (six) hours as needed for severe pain., Starting Sun 11/05/2016, Print         Alfonzo Beers, MD 11/08/16 [phone]

## 2016-11-05 NOTE — ED Notes (Signed)
Patient stated that he can not give sample at this time. Patient asking for water. EDP ok'd water for patient to drink to help collect urine sample.

## 2016-11-05 NOTE — Discharge Instructions (Signed)
Return to the ED with any concerns including weakness of legs, not able to urinate, loss of control of bowel or bladder, flank pain, fever/chills, blood in urine, decreased level of alertness/lethargy, or any other alarming symptoms

## 2016-11-05 NOTE — ED Triage Notes (Signed)
Pt has hx of lumbar surgery 20 years ago. Started having worsening L lower back pain last night that feels just like before he had his back surgery. No known recent injuries. Pt ambulatory. When he stands lower back has a pulling sensation.

## 2017-12-21 ENCOUNTER — Ambulatory Visit (INDEPENDENT_AMBULATORY_CARE_PROVIDER_SITE_OTHER): Payer: Medicare Other

## 2017-12-21 ENCOUNTER — Encounter (HOSPITAL_COMMUNITY): Payer: Self-pay | Admitting: Emergency Medicine

## 2017-12-21 ENCOUNTER — Ambulatory Visit (HOSPITAL_COMMUNITY)
Admission: EM | Admit: 2017-12-21 | Discharge: 2017-12-21 | Disposition: A | Discharge status: Home / Self Care | Payer: Medicare Other | Attending: Family Medicine | Admitting: Family Medicine

## 2017-12-21 ENCOUNTER — Other Ambulatory Visit: Payer: Self-pay

## 2017-12-21 DIAGNOSIS — R0789 Other chest pain: Secondary | ICD-10-CM

## 2017-12-21 DIAGNOSIS — W01198A Fall on same level from slipping, tripping and stumbling with subsequent striking against other object, initial encounter: Secondary | ICD-10-CM | POA: Diagnosis not present

## 2017-12-21 DIAGNOSIS — S20212A Contusion of left front wall of thorax, initial encounter: Secondary | ICD-10-CM | POA: Diagnosis not present

## 2017-12-21 MED ORDER — NAPROXEN 500 MG PO TABS: 500.0000 mg | ORAL_TABLET | Freq: Two times a day (BID) | ORAL | 0 refills | Status: DC

## 2017-12-21 NOTE — Discharge Instructions (Addendum)
Ice to the area, especially at the end of the day.  If no improvement in the next 2 weeks return to be seen or follow up with your primary care provider.   Dg Ribs Unilateral W/chest Left  Result Date: 12/21/2017 CLINICAL DATA:  Fall 10 days ago.  Left chest pain EXAM: LEFT RIBS AND CHEST - 3+ VIEW COMPARISON:  07/15/2015 FINDINGS: No fracture or other bone lesions are seen involving the ribs. There is no evidence of pneumothorax or pleural effusion. Both lungs are clear. Heart size and mediastinal contours are within normal limits. IMPRESSION: Negative. Electronically Signed   By: Franchot Gallo M.D.   On: 12/21/2017 11:15

## 2017-12-21 NOTE — ED Provider Notes (Signed)
[facility]   [id number] 12/21/17 Arrival Time: 1007   SUBJECTIVE:  Brandon Goodman is a 67 y.o. male who presents to the urgent care with complaint left rib pain.  About 10 days ago he was roughhousing with his grandkids and landed on top of one of them while they were on their bike. Pt states the handlebar of the bike hit him in the L rib area. Pt c/o ongoing L rib pain from the injury.  Pain worse with deep breath, cough or sneeze  Past Medical History:  Diagnosis Date  . Abnormal EKG    September, 2013  . Blood pressure alteration    Blood pressure high September 06, 2012, no prior history of hypertension  . Ejection fraction    EF 60-65%, echo, August 23, 2012  . Kidney stone   . Mitral regurgitation    Mild, echo, September, 2013   No family history on file. Social History   Socioeconomic History  . Marital status: Married    Spouse name: Not on file  . Number of children: Not on file  . Years of education: Not on file  . Highest education level: Not on file  Social Needs  . Financial resource strain: Not on file  . Food insecurity - worry: Not on file  . Food insecurity - inability: Not on file  . Transportation needs - medical: Not on file  . Transportation needs - non-medical: Not on file  Occupational History  . Not on file  Tobacco Use  . Smoking status: Former Smoker    Packs/day: 0.30    Years: 5.00    Pack years: 1.50    Types: Cigarettes    Last attempt to quit: 11/27/1974    Years since quitting: 43.0  . Smokeless tobacco: Never Used  Substance and Sexual Activity  . Alcohol use: Yes  . Drug use: No  . Sexual activity: Not on file  Other Topics Concern  . Not on file  Social History Narrative  . Not on file   Current Meds  Medication Sig  . lisinopril (PRINIVIL,ZESTRIL) 5 MG tablet Take 5 mg by mouth daily.   No Known Allergies    ROS: As per HPI, remainder of ROS negative.   OBJECTIVE:   Vitals:   12/21/17 1038  BP:  131/67  Pulse: (!) 55  Resp: 14  Temp: 97.7 F (36.5 C)  SpO2: 99%     General appearance: alert; no distress Eyes: PERRL; EOMI; conjunctiva normal HENT: normocephalic; atraumatic; Neck: supple Lungs: clear to auscultation bilaterally;  Tender left lower lateral ribs.  No ecchymosis or deformity.  No crepitus or rales. Heart: regular rate and rhythm Back: no CVA tenderness Extremities: no cyanosis or edema; symmetrical with no gross deformities Skin: warm and dry Neurologic: normal gait; grossly normal Psychological: alert and cooperative; normal mood and affect      Labs:  Results for orders placed or performed during the hospital encounter of 11/05/16  Urinalysis, Routine w reflex microscopic- may I&O cath if menses  Result Value Ref Range   Color, Urine YELLOW YELLOW   APPearance CLEAR CLEAR   Specific Gravity, Urine 1.027 1.005 - 1.030   pH 5.0 5.0 - 8.0   Glucose, UA NEGATIVE NEGATIVE mg/dL   Hgb urine dipstick SMALL (A) NEGATIVE   Bilirubin Urine NEGATIVE NEGATIVE   Ketones, ur NEGATIVE NEGATIVE mg/dL   Protein, ur NEGATIVE NEGATIVE mg/dL   Nitrite NEGATIVE NEGATIVE   Leukocytes, UA NEGATIVE NEGATIVE  RBC / HPF 0-5 0 - 5 RBC/hpf   WBC, UA 0-5 0 - 5 WBC/hpf   Bacteria, UA NONE SEEN NONE SEEN   Squamous Epithelial / LPF NONE SEEN NONE SEEN   Mucus PRESENT    Hyaline Casts, UA PRESENT     Labs Reviewed - No data to display  Dg Ribs Unilateral W/chest Left  Result Date: 12/21/2017 CLINICAL DATA:  Fall 10 days ago.  Left chest pain EXAM: LEFT RIBS AND CHEST - 3+ VIEW COMPARISON:  07/15/2015 FINDINGS: No fracture or other bone lesions are seen involving the ribs. There is no evidence of pneumothorax or pleural effusion. Both lungs are clear. Heart size and mediastinal contours are within normal limits. IMPRESSION: Negative. Electronically Signed   By: Franchot Gallo M.D.   On: 12/21/2017 11:15       ASSESSMENT & PLAN:  1. Rib contusion, left, initial  encounter     Meds ordered this encounter  Medications  . naproxen (NAPROSYN) 500 MG tablet    Sig: Take 1 tablet (500 mg total) by mouth 2 (two) times daily.    Dispense:  30 tablet    Refill:  0    Order Specific Question:   Supervising Provider    Answer:   Wynona Luna [[id number]]    Reviewed expectations re: course of current medical issues. Questions answered. Outlined signs and symptoms indicating need for more acute intervention. Patient verbalized understanding. After Visit Summary given.       Robyn Haber, MD 12/21/17 1137

## 2017-12-21 NOTE — ED Provider Notes (Signed)
[facility]    CSN: [id number] Arrival date & time: 12/21/17  1007     History   Chief Complaint Chief Complaint  Patient presents with  . Chest Pain    HPI Brandon Goodman is a 67 y.o. male.   Brandon Goodman presents with complaints of left rib pain after he fell onto the bike handle of his grandchild's bike while playing approximately 10 days ago. Pain is worse with engagement of left ribs such as with sneezing, rolling over in bed, touching of the area or stretching it. Without cough, shortness of breath, chest pain, fevers. Pain is 5/10. Has not taken any medications for symptoms. Patient takes lisinipril daily for bp, but otherwise does not take any medications daily. Denies any history of previous rib or chest wall injury.    ROS per HPI.       Past Medical History:  Diagnosis Date  . Abnormal EKG    September, 2013  . Blood pressure alteration    Blood pressure high September 06, 2012, no prior history of hypertension  . Ejection fraction    EF 60-65%, echo, August 23, 2012  . Kidney stone   . Mitral regurgitation    Mild, echo, September, 2013    Patient Active Problem List   Diagnosis Date Noted  . Blood pressure alteration   . Abnormal EKG   . Ejection fraction   . Mitral regurgitation     Past Surgical History:  Procedure Laterality Date  . BACK SURGERY         Home Medications    Prior to Admission medications   Medication Sig Start Date End Date Taking? Authorizing Provider  lisinopril (PRINIVIL,ZESTRIL) 5 MG tablet Take 5 mg by mouth daily.   Yes [provider]  Multiple Vitamins-Minerals (MULTIVITAMIN WITH MINERALS) tablet Take 1 tablet by mouth daily.    [provider]  naproxen (NAPROSYN) 500 MG tablet Take 1 tablet (500 mg total) by mouth 2 (two) times daily. 12/21/17   Zigmund Gottron, NP    Family History No family history on file.  Social History Social History   Tobacco Use  . Smoking status: Former  Smoker    Packs/day: 0.30    Years: 5.00    Pack years: 1.50    Types: Cigarettes    Last attempt to quit: 11/27/1974    Years since quitting: 43.0  . Smokeless tobacco: Never Used  Substance Use Topics  . Alcohol use: Yes  . Drug use: No     Allergies   Patient has no known allergies.   Review of Systems Review of Systems   Physical Exam Triage Vital Signs ED Triage Vitals  Enc Vitals Group     BP 12/21/17 1038 131/67     Pulse Rate 12/21/17 1038 (!) 55     Resp 12/21/17 1038 14     Temp 12/21/17 1038 97.7 F (36.5 C)     Temp src --      SpO2 12/21/17 1038 99 %     Weight --      Height --      Head Circumference --      Peak Flow --      Pain Score 12/21/17 1042 5     Pain Loc --      Pain Edu? --      Excl. in [place]? --    No data found.  Updated Vital Signs BP 131/67   Pulse (!) 55  Temp 97.7 F (36.5 C)   Resp 14   SpO2 99%   Visual Acuity Right Eye Distance:   Left Eye Distance:   Bilateral Distance:    Right Eye Near:   Left Eye Near:    Bilateral Near:     Physical Exam  Constitutional: He is oriented to person, place, and time. He appears well-developed and well-nourished.  Cardiovascular: Normal rate and regular rhythm.  Pulmonary/Chest: Effort normal and breath sounds normal. He has no decreased breath sounds. He exhibits tenderness.  Point tenderness to distal lateral left ribs    Neurological: He is alert and oriented to person, place, and time.  Skin: Skin is warm and dry.     UC Treatments / Results  Labs (all labs ordered are listed, but only abnormal results are displayed) Labs Reviewed - No data to display  EKG  EKG Interpretation None       Radiology Dg Ribs Unilateral W/chest Left  Result Date: 12/21/2017 CLINICAL DATA:  Fall 10 days ago.  Left chest pain EXAM: LEFT RIBS AND CHEST - 3+ VIEW COMPARISON:  07/15/2015 FINDINGS: No fracture or other bone lesions are seen involving the ribs. There is no evidence of  pneumothorax or pleural effusion. Both lungs are clear. Heart size and mediastinal contours are within normal limits. IMPRESSION: Negative. Electronically Signed   By: Franchot Gallo M.D.   On: 12/21/2017 11:15    Procedures Procedures (including critical care time)  Medications Ordered in UC Medications - No data to display   Initial Impression / Assessment and Plan / UC Course  I have reviewed the triage vital signs and the nursing notes.  Pertinent labs & imaging results that were available during my care of the patient were reviewed by me and considered in my medical decision making (see chart for details).     Negative chest at this time. Without shortness of breath, difficulty breathing, hypoxia. Non toxic in appearance. reproducible tenderness at area struck by bike handle bar. Naproxen bid, take with food, ice application. Activity as tolerated. Return precautions provided. Patient verbalized understanding and agreeable to plan.     Final Clinical Impressions(s) / UC Diagnoses   Final diagnoses:  Rib contusion, left, initial encounter    ED Discharge Orders        Ordered    naproxen (NAPROSYN) 500 MG tablet  2 times daily     12/21/17 1125       Controlled Substance Prescriptions [place] Controlled Substance Registry consulted? Not Applicable   Zigmund Gottron, NP 12/21/17 1126
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
## 2019-12-03 ENCOUNTER — Other Ambulatory Visit: Payer: Self-pay | Admitting: Radiology

## 2019-12-03 ENCOUNTER — Telehealth: Payer: Self-pay | Admitting: Radiology

## 2019-12-03 DIAGNOSIS — R55 Syncope and collapse: Secondary | ICD-10-CM

## 2019-12-03 NOTE — Telephone Encounter (Signed)
Enrolled patient for a 2 week [employer] Event monitor to be mailed to patients home. Brief instructions were gone over with the patients wife and they know to expect the monitor to arrive in 5-6 days.

## 2019-12-05 ENCOUNTER — Ambulatory Visit (INDEPENDENT_AMBULATORY_CARE_PROVIDER_SITE_OTHER): Payer: Medicare Other

## 2019-12-05 DIAGNOSIS — R55 Syncope and collapse: Secondary | ICD-10-CM | POA: Diagnosis not present

## 2019-12-05 NOTE — Telephone Encounter (Signed)
Follow up  The patient's wife is asking is asking for a call back in reference to the monitor. She states that the monitor has not been received.

## 2019-12-05 NOTE — Telephone Encounter (Signed)
Monitor should be delived by 8pm tonight. Tracking number [id number]

## 2019-12-26 ENCOUNTER — Encounter: Payer: Self-pay | Admitting: Interventional Cardiology

## 2019-12-26 ENCOUNTER — Other Ambulatory Visit (HOSPITAL_COMMUNITY): Payer: Self-pay | Admitting: Internal Medicine

## 2019-12-26 ENCOUNTER — Telehealth (INDEPENDENT_AMBULATORY_CARE_PROVIDER_SITE_OTHER): Payer: Medicare Other | Admitting: Cardiovascular Disease

## 2019-12-26 DIAGNOSIS — R55 Syncope and collapse: Secondary | ICD-10-CM

## 2019-12-26 NOTE — Telephone Encounter (Signed)
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 Discussed referral to EP for ILR if he has recurrent episode

## 2019-12-26 NOTE — Telephone Encounter (Signed)
error 

## 2020-01-02 ENCOUNTER — Other Ambulatory Visit: Payer: Self-pay

## 2020-01-02 ENCOUNTER — Ambulatory Visit (HOSPITAL_COMMUNITY)
Admission: RE | Admit: 2020-01-02 | Discharge: 2020-01-02 | Disposition: A | Discharge status: Home / Self Care | Payer: Medicare Other | Source: Ambulatory Visit | Attending: Internal Medicine | Admitting: Internal Medicine

## 2020-01-02 DIAGNOSIS — R55 Syncope and collapse: Secondary | ICD-10-CM

## 2020-01-02 DIAGNOSIS — Z87891 Personal history of nicotine dependence: Secondary | ICD-10-CM | POA: Insufficient documentation

## 2020-01-02 NOTE — Progress Notes (Signed)
  Echocardiogram 2D Echocardiogram has been performed.  Brandon Goodman 01/02/2020, 10:29 AM

## 2020-04-12 ENCOUNTER — Other Ambulatory Visit: Payer: Self-pay

## 2020-04-12 ENCOUNTER — Encounter: Payer: Self-pay | Admitting: Dermatology

## 2020-04-12 ENCOUNTER — Ambulatory Visit (INDEPENDENT_AMBULATORY_CARE_PROVIDER_SITE_OTHER): Payer: Medicare Other | Admitting: Dermatology

## 2020-04-12 DIAGNOSIS — D229 Melanocytic nevi, unspecified: Secondary | ICD-10-CM

## 2020-04-12 DIAGNOSIS — L570 Actinic keratosis: Secondary | ICD-10-CM

## 2020-04-12 DIAGNOSIS — Z1283 Encounter for screening for malignant neoplasm of skin: Secondary | ICD-10-CM | POA: Diagnosis not present

## 2020-04-12 DIAGNOSIS — D225 Melanocytic nevi of trunk: Secondary | ICD-10-CM | POA: Diagnosis not present

## 2020-04-12 DIAGNOSIS — C4492 Squamous cell carcinoma of skin, unspecified: Secondary | ICD-10-CM

## 2020-04-12 DIAGNOSIS — D485 Neoplasm of uncertain behavior of skin: Secondary | ICD-10-CM | POA: Diagnosis not present

## 2020-04-12 HISTORY — DX: Squamous cell carcinoma of skin, unspecified: C44.92

## 2020-04-12 NOTE — Progress Notes (Signed)
No h/o of tanning beds or booth.

## 2020-04-14 ENCOUNTER — Telehealth: Payer: Self-pay

## 2020-04-14 NOTE — Telephone Encounter (Signed)
Phone call to patient with his Pathology results.  Voicemail left for patient to give the office a call back.

## 2020-04-14 NOTE — Telephone Encounter (Signed)
-----   Message from Lavonna Monarch, MD sent at 04/14/2020  4:57 AM EDT ----- Schedule surgery with Dr. Darene Lamer

## 2020-04-16 ENCOUNTER — Telehealth: Payer: Self-pay | Admitting: *Deleted

## 2020-04-16 ENCOUNTER — Encounter: Payer: Self-pay | Admitting: *Deleted

## 2020-04-16 NOTE — Telephone Encounter (Signed)
  Left message for patient to call us back and get his pathology results and schedule 30 minute with Dr. Denna Haggard.

## 2020-04-17 ENCOUNTER — Encounter: Payer: Self-pay | Admitting: Dermatology

## 2020-04-17 NOTE — Progress Notes (Signed)
   Follow-Up Visit   Subjective  Brandon Goodman is a 69 y.o. male who presents for the following: Annual Exam (no concerns).  Crusts Location: Scalp and face Duration:  Quality: Some enlargement Associated Signs/Symptoms: Modifying Factors:  Severity:  Timing: Context:   The following portions of the chart were reviewed this encounter and updated as appropriate: Tobacco  Allergies  Meds  Problems  Med Hx  Surg Hx  Fam Hx      Objective  Well appearing patient in no apparent distress; mood and affect are within normal limits.  A full examination was performed including scalp, head, eyes, ears, nose, lips, neck, chest, axillae, abdomen, back, buttocks, bilateral upper extremities, bilateral lower extremities, hands, feet, fingers, toes, fingernails, and toenails. All findings within normal limits unless otherwise noted below.   Assessment & Plan  AK (actinic keratosis) (2) Mid Forehead; Right Temple  Destruction of lesion - Mid Forehead, Right Temple  Destruction method: cryotherapy   Informed consent: discussed and consent obtained   Timeout:  patient name, date of birth, surgical site, and procedure verified Lesion destroyed using liquid nitrogen: Yes   Region frozen until ice ball extended beyond lesion: Yes   Cryotherapy cycles:  5 Outcome: patient tolerated procedure well with no complications    Neoplasm of uncertain behavior of skin (2) Medial Scalp  Skin / nail biopsy Type of biopsy: tangential   Informed consent: discussed and consent obtained   Timeout: patient name, date of birth, surgical site, and procedure verified   Procedure prep:  Patient was prepped and draped in usual sterile fashion Prep type:  Chlorhexidine Anesthesia: the lesion was anesthetized in a standard fashion   Anesthetic:  1% lidocaine w/ epinephrine 1-100,000 local infiltration Instrument used: flexible razor blade   Hemostasis achieved with: ferric subsulfate   Outcome: patient  tolerated procedure well   Post-procedure details: wound care instructions given    Specimen 1 - Surgical pathology Differential Diagnosis: ro bcc scc Check Margins: No  lateral Scalp  Skin / nail biopsy Type of biopsy: tangential   Informed consent: discussed and consent obtained   Timeout: patient name, date of birth, surgical site, and procedure verified   Procedure prep:  Patient was prepped and draped in usual sterile fashion Prep type:  Chlorhexidine Anesthesia: the lesion was anesthetized in a standard fashion   Anesthetic:  1% lidocaine w/ epinephrine 1-100,000 local infiltration Instrument used: flexible razor blade   Hemostasis achieved with: ferric subsulfate   Outcome: patient tolerated procedure well   Post-procedure details: wound care instructions given    Specimen 2 - Surgical pathology Differential Diagnosis: r/o bcc scc Check Margins: No  Nevus Mid Back  Skin cancer screening performed today.

## 2020-04-19 NOTE — Telephone Encounter (Signed)
-----   Message from Lavonna Monarch, MD sent at 04/14/2020  4:57 AM EDT ----- Schedule surgery with Dr. Darene Lamer

## 2020-04-19 NOTE — Telephone Encounter (Signed)
Phone call to patient with his pathology results. Voicemail left for patient to give the office a call back.  ?

## 2020-04-22 ENCOUNTER — Telehealth: Payer: Self-pay | Admitting: *Deleted

## 2020-04-22 ENCOUNTER — Telehealth (INDEPENDENT_AMBULATORY_CARE_PROVIDER_SITE_OTHER): Payer: Medicare Other | Admitting: *Deleted

## 2020-04-22 DIAGNOSIS — Z719 Counseling, unspecified: Secondary | ICD-10-CM

## 2020-04-22 NOTE — Telephone Encounter (Signed)
Left message for patient to call us back for pathology results.  

## 2020-04-22 NOTE — Telephone Encounter (Signed)
Left message for patient to call us back and get pathology results

## 2020-04-22 NOTE — Telephone Encounter (Signed)
Left message to call us back

## 2020-04-27 NOTE — Telephone Encounter (Signed)
-----   Message from Lavonna Monarch, MD sent at 04/14/2020  4:57 AM EDT ----- Schedule surgery with Dr. Darene Lamer

## 2020-04-28 NOTE — Telephone Encounter (Signed)
-----   Message from Lavonna Monarch, MD sent at 04/14/2020  4:57 AM EDT ----- Schedule surgery with Dr. Darene Lamer

## 2020-04-28 NOTE — Telephone Encounter (Signed)
Phone call from patient returning our call for his pathology results. Patient aware of results.  

## 2020-05-01 NOTE — Telephone Encounter (Signed)
Brandon Goodman called to express minor concern about waiting to have the skin cancer on his scalp treated until July.  I told him that there was no issue with this affecting the overall cure but if it caused him anxiety I would work him in sooner.  He said he was comfortable with waiting until July.  All his questions were answered.

## 2020-06-10 ENCOUNTER — Other Ambulatory Visit: Payer: Self-pay

## 2020-06-10 ENCOUNTER — Ambulatory Visit (INDEPENDENT_AMBULATORY_CARE_PROVIDER_SITE_OTHER): Payer: Medicare Other | Admitting: Dermatology

## 2020-06-10 ENCOUNTER — Encounter: Payer: Self-pay | Admitting: Dermatology

## 2020-06-10 DIAGNOSIS — D044 Carcinoma in situ of skin of scalp and neck: Secondary | ICD-10-CM

## 2020-06-10 DIAGNOSIS — D099 Carcinoma in situ, unspecified: Secondary | ICD-10-CM

## 2020-06-10 NOTE — Patient Instructions (Signed)

## 2020-06-10 NOTE — Progress Notes (Signed)
Lesion size = 0.8cm

## 2020-07-05 ENCOUNTER — Encounter: Payer: Self-pay | Admitting: Dermatology

## 2020-07-05 NOTE — Progress Notes (Signed)
   Follow-Up Visit   Subjective  Brandon Goodman is a 69 y.o. male who presents for the following: Procedure (here for tx- medial scalp- cis x 1).  CIS Location: Scalp Duration:  Quality:  Associated Signs/Symptoms: Modifying Factors:  Severity:  Timing: Context: For treatment  Objective  Well appearing patient in no apparent distress; mood and affect are within normal limits.  A focused examination was performed including Head and neck.. Relevant physical exam findings are noted in the Assessment and Plan.   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  Destruction of lesion Complexity: simple   Destruction method: electrodesiccation and curettage   Informed consent: discussed and consent obtained   Timeout:  patient name, date of birth, surgical site, and procedure verified Anesthesia: the lesion was anesthetized in a standard fashion   Anesthetic:  1% lidocaine w/ epinephrine 1-100,000 local infiltration Curettage performed in three different directions: Yes   Curettage cycles:  3 Lesion length (cm):  0.8 Lesion width (cm):  0.8 Margin per side (cm):  0 Final wound size (cm):  0.8 Hemostasis achieved with:  ferric subsulfate Outcome: patient tolerated procedure well with no complications   Post-procedure details: wound care instructions given   Additional details:  Inoculated with parenteral 5% fluorouracil     I, Lavonna Monarch, MD, have reviewed all documentation for this visit.  The documentation on 07/05/20 for the exam, diagnosis, procedures, and orders are all accurate and complete.

## 2020-10-12 ENCOUNTER — Other Ambulatory Visit: Payer: Self-pay | Admitting: Urology

## 2020-10-12 DIAGNOSIS — C61 Malignant neoplasm of prostate: Secondary | ICD-10-CM

## 2020-11-05 ENCOUNTER — Ambulatory Visit
Admission: RE | Admit: 2020-11-05 | Discharge: 2020-11-05 | Disposition: A | Discharge status: Home / Self Care | Payer: Medicare Other | Source: Ambulatory Visit | Attending: Urology | Admitting: Urology

## 2020-11-05 DIAGNOSIS — C61 Malignant neoplasm of prostate: Secondary | ICD-10-CM

## 2020-11-05 MED ORDER — GADOBENATE DIMEGLUMINE 529 MG/ML IV SOLN
19.0000 mL | Freq: Once | INTRAVENOUS | Status: AC | PRN
  Administered 2020-11-05: 19 mL via INTRAVENOUS

## 2020-12-17 ENCOUNTER — Other Ambulatory Visit: Payer: Self-pay | Admitting: Orthopedic Surgery

## 2020-12-17 ENCOUNTER — Ambulatory Visit
Admission: RE | Admit: 2020-12-17 | Discharge: 2020-12-17 | Disposition: A | Discharge status: Home / Self Care | Payer: Medicare Other | Source: Ambulatory Visit | Attending: Orthopedic Surgery | Admitting: Orthopedic Surgery

## 2020-12-17 ENCOUNTER — Other Ambulatory Visit: Payer: Self-pay

## 2020-12-17 DIAGNOSIS — M25511 Pain in right shoulder: Secondary | ICD-10-CM

## 2020-12-18 ENCOUNTER — Other Ambulatory Visit: Payer: Medicare Other

## 2021-01-25 DIAGNOSIS — C61 Malignant neoplasm of prostate: Secondary | ICD-10-CM | POA: Insufficient documentation

## 2021-01-31 ENCOUNTER — Encounter: Payer: Self-pay | Admitting: Radiation Oncology

## 2021-01-31 DIAGNOSIS — N1831 Chronic kidney disease, stage 3a: Secondary | ICD-10-CM | POA: Insufficient documentation

## 2021-01-31 DIAGNOSIS — D696 Thrombocytopenia, unspecified: Secondary | ICD-10-CM | POA: Insufficient documentation

## 2021-01-31 DIAGNOSIS — Z1211 Encounter for screening for malignant neoplasm of colon: Secondary | ICD-10-CM | POA: Insufficient documentation

## 2021-01-31 DIAGNOSIS — E785 Hyperlipidemia, unspecified: Secondary | ICD-10-CM | POA: Insufficient documentation

## 2021-01-31 DIAGNOSIS — I10 Essential (primary) hypertension: Secondary | ICD-10-CM | POA: Insufficient documentation

## 2021-01-31 DIAGNOSIS — R972 Elevated prostate specific antigen [PSA]: Secondary | ICD-10-CM | POA: Insufficient documentation

## 2021-01-31 NOTE — Progress Notes (Addendum)
GU Location of Tumor / Histology: prostatic adenocarcinoma  If Prostate Cancer, Gleason Score is (3 + 4) and PSA is (4.23). Prostate volume: 30 g.   Brandon Goodman was originally diagnosed with prostate cancer in April 2021 with Gleason 3+3 prostatic adenocarcinoma in 1 core. Patient opted for active surveillance. Patient returned for a repeat biopsy in February 2022 which revealed progression.  Biopsies of prostate (if applicable) revealed:    Past/Anticipated interventions by urology, if any: prostate biopsy, active surveillance, prostate biopsy, referral to Dr. Tammi Klippel to discuss radiation options.  Past/Anticipated interventions by medical oncology, if any: no  Weight changes, if any: no  Bowel/Bladder complaints, if any: IPSS 1. SHIM 17. Denies dysuria, hematuria, urinary leakage or incontinence. Denies any bowel complaints. Denies any problems with exertions.   Nausea/Vomiting, if any: no  Pain issues, if any:  Mild pain right shoulder related to rotator cuff surgery 3 weeks ago.   SAFETY ISSUES:  Prior radiation? denies  Pacemaker/ICD? denies  Possible current pregnancy? no, male patient  Is the patient on methotrexate? no  Current Complaints / other details:  70 year old male. Married with 2 daughters. Resides in [place]. Mother with history of breast cancer.

## 2021-02-01 ENCOUNTER — Ambulatory Visit
Admission: RE | Admit: 2021-02-01 | Discharge: 2021-02-01 | Disposition: A | Discharge status: Home / Self Care | Payer: Medicare Other | Source: Ambulatory Visit | Attending: Radiation Oncology | Admitting: Radiation Oncology

## 2021-02-01 ENCOUNTER — Other Ambulatory Visit: Payer: Self-pay

## 2021-02-01 ENCOUNTER — Encounter: Payer: Self-pay | Admitting: Radiation Oncology

## 2021-02-01 VITALS — BP 155/85 | HR 57 | Temp 96.8°F | Resp 18 | Ht 72.0 in | Wt 204.2 lb

## 2021-02-01 DIAGNOSIS — C61 Malignant neoplasm of prostate: Secondary | ICD-10-CM

## 2021-02-01 DIAGNOSIS — Z87891 Personal history of nicotine dependence: Secondary | ICD-10-CM | POA: Diagnosis not present

## 2021-02-01 DIAGNOSIS — Z87442 Personal history of urinary calculi: Secondary | ICD-10-CM | POA: Insufficient documentation

## 2021-02-01 HISTORY — DX: Malignant neoplasm of prostate: C61

## 2021-02-01 NOTE — Progress Notes (Signed)
Radiation Oncology         [phone]) [phone] ________________________________  Initial outpatient Consultation  Name: Archie Shea MRN: [id number]  Date: 02/01/2021  DOB: [birth year]  IO:[id number], Denton Ar, MD  Lucas Mallow, MD   REFERRING PHYSICIAN: Lucas Mallow, MD  DIAGNOSIS: 70 y.o. gentleman with Stage T1c adenocarcinoma of the prostate with Gleason score of 3+4, and PSA of 4.23.    ICD-10-CM   1. Malignant neoplasm of prostate ([place])  C61   2. Primary prostate cancer identified by needle biopsy (T1c) ([place])  C61     HISTORY OF PRESENT ILLNESS: Brandon Goodman is a 70 y.o. male with a diagnosis of prostate cancer. He was noted to have an elevated PSA of 5.7 by his primary care physician, Dr. Lysle Rubens.  Accordingly, he was referred for evaluation in urology by Dr. Gloriann Loan on 04/30/2020,  digital rectal examination was performed at that time revealing no nodules.  Repeat PSA showed a slight decrease but remained elevated at 5.09.  The patient proceeded to transrectal ultrasound with 12 biopsies of the prostate on 05/07/2020. He was found to have a very small (5%) focus of Gleason 3+3 prostatic adenocarcinoma in 1 of 12 cores. After a thorough discussion regarding treatment options, he opted to proceed in active surveillance at that time.  He underwent surveillance prostate MRI on 11/05/2020 showing a 7 mm PIRADS 4 lesion in the left lateral peripheral zone at the apex without extracapsular extension, nodal disease, or bony metastasis. The prostate volume was estimated to be 30.2 cc.   Axial T2 showing Lesion   Sagittal T2 with hashed line at axial level   A repeat PSA on 12/10/2020 remained stable to slightly decreased at 4.23. He proceeded to MRI fusion biopsy of the prostate on 12/29/2020. Out of 16 core biopsies, 2 were positive.  The maximum Gleason score was 3+4, and this was seen in one of four ROI MRI lesion samples (10%) and the left apex (again in only 5% of the sample).  The patient  reviewed the biopsy results with his urologist and he has kindly been referred today for discussion of potential radiation treatment options.   PREVIOUS RADIATION THERAPY: No  PAST MEDICAL HISTORY:  Past Medical History:  Diagnosis Date  . Abnormal EKG    September, 2013  . Blood pressure alteration    Blood pressure high September 06, 2012, no prior history of hypertension  . Ejection fraction    EF 60-65%, echo, August 23, 2012  . Kidney stone   . Mitral regurgitation    Mild, echo, September, 2013  . Prostate cancer ([place])   . SCCA (squamous cell carcinoma) of skin 04/12/2020   medial scalp(CX35FU)      PAST SURGICAL HISTORY: Past Surgical History:  Procedure Laterality Date  . BACK SURGERY    . PROSTATE BIOPSY      FAMILY HISTORY:  Family History  Problem Relation Age of Onset  . Breast cancer Mother   . Prostate cancer Neg Hx   . Colon cancer Neg Hx   . Pancreatic cancer Neg Hx     SOCIAL HISTORY:  Social History   Socioeconomic History  . Marital status: Married    Spouse name: Not on file  . Number of children: 2  . Years of education: Not on file  . Highest education level: Not on file  Occupational History  . Not on file  Tobacco Use  . Smoking status: Former Smoker    Packs/day:  0.30    Years: 5.00    Pack years: 1.50    Types: Cigarettes    Quit date: 11/27/1974    Years since quitting: 46.2  . Smokeless tobacco: Never Used  Vaping Use  . Vaping Use: Never used  Substance and Sexual Activity  . Alcohol use: Yes  . Drug use: No  . Sexual activity: Yes  Other Topics Concern  . Not on file  Social History Narrative  . Not on file   Social Determinants of Health   Financial Resource Strain: Not on file  Food Insecurity: Not on file  Transportation Needs: Not on file  Physical Activity: Not on file  Stress: Not on file  Social Connections: Not on file  Intimate Partner Violence: Not on file    ALLERGIES: Patient has no known  allergies.  MEDICATIONS:  Current Outpatient Medications  Medication Sig Dispense Refill  . lisinopril (ZESTRIL) 10 MG tablet Take by mouth. Reports taking 5 mg daily.    . Multiple Vitamins-Minerals (MULTIVITAMIN WITH MINERALS) tablet Take 1 tablet by mouth daily.    Marland Kitchen tiZANidine (ZANAFLEX) 2 MG tablet TAKE 1 TABLET BY MOUTH EVERY EIGHT HOURS AS NEEDED MUSCLE PAIN (Patient not taking: Reported on 02/01/2021)     No current facility-administered medications for this encounter.    REVIEW OF SYSTEMS:  On review of systems, the patient reports that he is doing well overall. He denies any chest pain, shortness of breath, cough, fevers, chills, night sweats, unintended weight changes. He denies any bowel disturbances, and denies abdominal pain, nausea or vomiting. He denies any new musculoskeletal or joint aches or pains. His IPSS was 1, indicating minimal urinary symptoms. His SHIM was 17, indicating he has moderate erectile dysfunction. A complete review of systems is obtained and is otherwise negative.    PHYSICAL EXAM:  Wt Readings from Last 3 Encounters:  02/01/21 204 lb 4 oz (92.6 kg)  09/19/12 201 lb (91.2 kg)  09/06/12 201 lb (91.2 kg)   Temp Readings from Last 3 Encounters:  02/01/21 (!) 96.8 F (36 C) (Temporal)  12/21/17 97.7 F (36.5 C)  11/05/16 98 F (36.7 C) (Oral)   BP Readings from Last 3 Encounters:  02/01/21 (!) 155/85  12/21/17 131/67  11/05/16 165/96   Pulse Readings from Last 3 Encounters:  02/01/21 (!) 57  12/21/17 (!) 55  11/05/16 (!) 55   Pain Assessment Pain Score: 0-No pain/10  In general this is a well appearing Caucasian male man in no acute distress. He's alert and oriented x4 and appropriate throughout the examination. Cardiopulmonary assessment is negative for acute distress, and he exhibits normal effort.     KPS = 100  100 - Normal; no complaints; no evidence of disease. 90   - Able to carry on normal activity; minor signs or symptoms of  disease. 80   - Normal activity with effort; some signs or symptoms of disease. 42   - Cares for self; unable to carry on normal activity or to do active work. 60   - Requires occasional assistance, but is able to care for most of his personal needs. 50   - Requires considerable assistance and frequent medical care. 70   - Disabled; requires special care and assistance. 68   - Severely disabled; hospital admission is indicated although death not imminent. 61   - Very sick; hospital admission necessary; active supportive treatment necessary. 10   - Moribund; fatal processes progressing rapidly. 0     -  Dead  Karnofsky DA, Abelmann Brule, Craver LS and Burchenal [facility] [phone]) The use of the nitrogen mustards in the palliative treatment of carcinoma: with particular reference to bronchogenic carcinoma Cancer 1 634-56  LABORATORY DATA:  Lab Results  Component Value Date   WBC 6.5 03/15/2008   HGB 13.8 DELTA CHECK NOTED 03/15/2008   HCT 40.0 03/15/2008   MCV 90.7 03/15/2008   PLT 118 (L) 03/15/2008   Lab Results  Component Value Date   NA 141 03/15/2008   K 4.0 03/15/2008   CL 106 03/15/2008   CO2 28 03/15/2008   Lab Results  Component Value Date   ALT 16 03/15/2008   AST 20 03/15/2008   ALKPHOS 51 03/15/2008   BILITOT 0.4 03/15/2008     RADIOGRAPHY: No results found.    IMPRESSION/PLAN: 1. 70 y.o. gentleman with Stage T1c adenocarcinoma of the prostate with Gleason Score of 3+4, and PSA of 4.23. We discussed the patient's workup and outlined the nature of prostate cancer in this setting. The patient's T stage, Gleason's score, and PSA put him into the favorable intermediate risk group. Accordingly, he is eligible for a variety of potential treatment options including brachytherapy, 5.5 weeks of external radiation, or prostatectomy. We also discussed the appropriateness of continued active surveillence, off guideline protocol, but given his small volume, favorable intermediate risk  disease, feel it would be appropriate. We discussed the available radiation techniques, and focused on the details and logistics of delivery. We discussed and outlined the risks, benefits, short and long-term effects associated with radiotherapy and compared and contrasted these with prostatectomy. We discussed the role of SpaceOAR gel in reducing the rectal toxicity associated with radiotherapy.  He appears to have a good understanding of his disease and our treatment recommendations which are of curative intent.  He and his wife were encouraged to ask questions that were answered to their stated satisfaction.  At the conclusion of our conversation, the patient is leaning towards continuing in active surveillance but reports that if/when the time comes that he needs to move forward with treatment, he would be most interested in pursuing brachytherapy. We will share our discussion with Dr. Gloriann Loan and look forward to following along the progress of this very nice gentleman.    Nicholos Johns, PA-C    Tyler Pita, MD  [facility] Direct Dial: [phone]  Fax: [phone] [email or web address]  Skype  [email or web address]   This document serves as a record of services personally performed by Tyler Pita, MD and Freeman Caldron, PA-C. It was created on their behalf by Wilburn Mylar, a trained medical scribe. The creation of this record is based on the scribe's personal observations and the provider's statements to them. This document has been checked and approved by the attending provider.

## 2021-04-18 ENCOUNTER — Ambulatory Visit (INDEPENDENT_AMBULATORY_CARE_PROVIDER_SITE_OTHER): Payer: Medicare Other | Admitting: Dermatology

## 2021-04-18 ENCOUNTER — Encounter: Payer: Self-pay | Admitting: Dermatology

## 2021-04-18 ENCOUNTER — Other Ambulatory Visit: Payer: Self-pay

## 2021-04-18 DIAGNOSIS — Z1283 Encounter for screening for malignant neoplasm of skin: Secondary | ICD-10-CM | POA: Diagnosis not present

## 2021-04-18 DIAGNOSIS — L570 Actinic keratosis: Secondary | ICD-10-CM | POA: Diagnosis not present

## 2021-04-28 ENCOUNTER — Other Ambulatory Visit: Payer: Self-pay | Admitting: Internal Medicine

## 2021-04-28 ENCOUNTER — Ambulatory Visit
Admission: RE | Admit: 2021-04-28 | Discharge: 2021-04-28 | Disposition: A | Discharge status: Home / Self Care | Payer: Medicare Other | Source: Ambulatory Visit | Attending: Internal Medicine | Admitting: Internal Medicine

## 2021-04-28 DIAGNOSIS — N200 Calculus of kidney: Secondary | ICD-10-CM

## 2021-04-30 NOTE — Progress Notes (Signed)
   Follow-Up Visit   Subjective  Brandon Goodman is a 70 y.o. male who presents for the following: Annual Exam (No new concerns).  Annual skin examination Location:  Duration:  Quality:  Associated Signs/Symptoms: Modifying Factors:  Severity:  Timing: Context:   Objective  Well appearing patient in no apparent distress; mood and affect are within normal limits. Objective  Left Abdomen (side) - Upper: General skin examination: no atypical pigmented lesions or non-melanoma skin cancers  Objective  Mid Forehead: Multiple small actinic keratoses forehead, scalp    A full examination was performed including scalp, head, eyes, ears, nose, lips, neck, chest, axillae, abdomen, back, buttocks, bilateral upper extremities, bilateral lower extremities, hands, feet, fingers, toes, fingernails, and toenails. All findings within normal limits unless otherwise noted below.   Assessment & Plan    Encounter for screening for malignant neoplasm of skin Left Abdomen (side) - Upper  Annual skin examination.  AK (actinic keratosis) Mid Forehead  PDT in the late fall or winter      I, Lavonna Monarch, MD, have reviewed all documentation for this visit.  The documentation on 04/30/21 for the exam, diagnosis, procedures, and orders are all accurate and complete.

## 2021-10-26 ENCOUNTER — Encounter: Payer: Self-pay | Admitting: Dermatology

## 2021-10-26 ENCOUNTER — Ambulatory Visit (INDEPENDENT_AMBULATORY_CARE_PROVIDER_SITE_OTHER): Payer: Medicare Other | Admitting: Dermatology

## 2021-10-26 ENCOUNTER — Other Ambulatory Visit: Payer: Self-pay

## 2021-10-26 DIAGNOSIS — Z1283 Encounter for screening for malignant neoplasm of skin: Secondary | ICD-10-CM | POA: Diagnosis not present

## 2021-10-26 DIAGNOSIS — L570 Actinic keratosis: Secondary | ICD-10-CM

## 2021-10-26 MED ORDER — TOLAK 4 % EX CREA: 1.0000 "application " | TOPICAL_CREAM | Freq: Every day | CUTANEOUS | 0 refills | Status: DC

## 2021-10-26 NOTE — Patient Instructions (Signed)
Apply tolak nightly total 28 applications if tolerable call if you have any issues. Expect brisk reaction no direct sunlight hat plus sunscreen.

## 2021-11-07 ENCOUNTER — Other Ambulatory Visit: Payer: Self-pay | Admitting: Urology

## 2021-11-07 DIAGNOSIS — C61 Malignant neoplasm of prostate: Secondary | ICD-10-CM

## 2021-11-14 ENCOUNTER — Encounter: Payer: Self-pay | Admitting: Dermatology

## 2021-11-14 NOTE — Progress Notes (Signed)
° °  Follow-Up Visit   Subjective  Brandon Goodman is a 70 y.o. male who presents for the following: Annual Exam (Check few scaly spots on scalp per wife).  Crust on scalp Location:  Duration:  Quality:  Associated Signs/Symptoms: Modifying Factors:  Severity:  Timing: Context:   Objective  Well appearing patient in no apparent distress; mood and affect are within normal limits. Scalp (5) Multiple small actinic keratoses on scalp plus half dozen thicker lesions.    A focused examination was performed including head and neck. Relevant physical exam findings are noted in the Assessment and Plan.   Assessment & Plan    AK (actinic keratosis) (5) Scalp  Discussed spot treatment versus field therapy, thicker lesions treated with LN2 freeze and will do Tolak 28 applications.  If he develops too much irritation he will contact me and we will modify this regimen.  Destruction of lesion - Scalp Complexity: simple   Destruction method: cryotherapy   Informed consent: discussed and consent obtained   Timeout:  patient name, date of birth, surgical site, and procedure verified Lesion destroyed using liquid nitrogen: Yes   Cryotherapy cycles:  3 Outcome: patient tolerated procedure well with no complications   Post-procedure details: wound care instructions given    Fluorouracil (TOLAK) 4 % CREA - Scalp Apply 1 application topically at bedtime.      I, Lavonna Monarch, MD, have reviewed all documentation for this visit.  The documentation on 11/14/21 for the exam, diagnosis, procedures, and orders are all accurate and complete.

## 2021-12-02 ENCOUNTER — Ambulatory Visit
Admission: RE | Admit: 2021-12-02 | Discharge: 2021-12-02 | Disposition: A | Discharge status: Home / Self Care | Payer: Medicare Other | Source: Ambulatory Visit | Attending: Urology | Admitting: Urology

## 2021-12-02 ENCOUNTER — Other Ambulatory Visit: Payer: Self-pay

## 2021-12-02 DIAGNOSIS — C61 Malignant neoplasm of prostate: Secondary | ICD-10-CM

## 2021-12-02 MED ORDER — GADOBENATE DIMEGLUMINE 529 MG/ML IV SOLN
19.0000 mL | Freq: Once | INTRAVENOUS | Status: AC | PRN
  Administered 2021-12-02: 19 mL via INTRAVENOUS

## 2021-12-05 ENCOUNTER — Other Ambulatory Visit: Payer: Medicare Other

## 2021-12-22 ENCOUNTER — Telehealth: Payer: Self-pay

## 2021-12-22 NOTE — Telephone Encounter (Signed)
Left voicemail for callback.

## 2021-12-30 ENCOUNTER — Other Ambulatory Visit: Payer: Self-pay

## 2021-12-30 ENCOUNTER — Encounter: Payer: Self-pay | Admitting: Urology

## 2021-12-30 ENCOUNTER — Ambulatory Visit
Admission: RE | Admit: 2021-12-30 | Discharge: 2021-12-30 | Disposition: A | Discharge status: Home / Self Care | Payer: Medicare Other | Source: Ambulatory Visit | Attending: Urology | Admitting: Urology

## 2021-12-30 ENCOUNTER — Ambulatory Visit
Admission: RE | Admit: 2021-12-30 | Discharge: 2021-12-30 | Disposition: A | Discharge status: Home / Self Care | Payer: Medicare Other | Source: Ambulatory Visit | Attending: Radiation Oncology | Admitting: Radiation Oncology

## 2021-12-30 VITALS — BP 128/68 | HR 51 | Temp 97.8°F | Resp 20 | Ht 72.0 in | Wt 207.0 lb

## 2021-12-30 DIAGNOSIS — C61 Malignant neoplasm of prostate: Secondary | ICD-10-CM

## 2021-12-30 DIAGNOSIS — Z87442 Personal history of urinary calculi: Secondary | ICD-10-CM | POA: Diagnosis not present

## 2021-12-30 DIAGNOSIS — Z85828 Personal history of other malignant neoplasm of skin: Secondary | ICD-10-CM | POA: Diagnosis not present

## 2021-12-30 DIAGNOSIS — Z803 Family history of malignant neoplasm of breast: Secondary | ICD-10-CM | POA: Diagnosis not present

## 2021-12-30 NOTE — Progress Notes (Signed)
Radiation Oncology         [phone] ________________________________  Outpatient Follow Up New  Name: Franco Duley MRN: [id number]  Date: 12/30/2021  DOB: 1950/11/30  SE:LTRVUY, [place] Ar, MD  Lucas Mallow, MD   REFERRING PHYSICIAN: Lucas Mallow, MD  DIAGNOSIS: 71 y.o. gentleman with Stage T1c adenocarcinoma of the prostate with Gleason score of 3+4, and PSA of 6.72.    ICD-10-CM   1. Malignant neoplasm of prostate ([place])  C61     2. Primary prostate cancer identified by needle biopsy (T1c) ([place])  C61        HISTORY OF PRESENT ILLNESS: Jalen Daluz is a 71 y.o. male with a diagnosis of prostate cancer. He was noted to have an elevated PSA of 5.7 by his primary care physician, Dr. Lysle Rubens.  Accordingly, he was referred for evaluation in urology by Dr. Gloriann Loan on 04/30/2020,  digital rectal examination was performed at that time revealing no nodules.  Repeat PSA showed a slight decrease but remained elevated at 5.09.  The patient proceeded to transrectal ultrasound with 12 biopsies of the prostate on 05/07/2020. He was found to have a very small (5%) focus of Gleason 3+3 prostatic adenocarcinoma in 1 of 12 cores. After a thorough discussion regarding treatment options, he opted to proceed in active surveillance at that time.  He underwent surveillance prostate MRI on 11/05/2020 showing a 7 mm PIRADS 4 lesion in the left lateral peripheral zone at the apex without extracapsular extension, nodal disease, or bony metastasis. The prostate volume was estimated to be 30.2 cc.   Axial T2 showing Lesion   Sagittal T2 with hashed line at axial level   A repeat PSA on 12/10/2020 remained stable to slightly decreased at 4.23. He proceeded to MRI fusion biopsy of the prostate on 12/29/2020. Out of 16 core biopsies, 2 were positive.  The maximum Gleason score was 3+4, and this was seen in one of four ROI MRI lesion samples (10%) and the left apex (again in only 5% of the sample).  We met with  the patient on 02/01/2021. Given his low volume of disease and his age, he opted to continue active surveillance. His PSA has gonitnued to gradually rise at 5.42 in 07/2021 and most recently, 6.72 on 11/11/21.  Surveillance prostate MRI was performed on 12/02/2021 showing no substantial change in size of the PI-RADS 4 lesion (biopsy-proven Gleason 3+4 disease) and only mildly increased conspicuity of the PI-RADS 3 lesion in the left anterior peripheral zone, mid gland.  He has met back with Dr. Gloriann Loan and spoken with Dr. Gaynelle Arabian whom is a personal friend. Dr. Gloriann Loan recommends the patient consider proceeding with treatment, but Dr. Gaynelle Arabian feels comfortable with active surveillance and the patient is most interested in continuing active surveillance as long as it remains a reasonable option.  The patient has kindly been referred back today to review potential radiation treatment options.   PREVIOUS RADIATION THERAPY: No  PAST MEDICAL HISTORY:  Past Medical History:  Diagnosis Date   Abnormal EKG    September, 2013   Blood pressure alteration    Blood pressure high September 06, 2012, no prior history of hypertension   Ejection fraction    EF 60-65%, echo, August 23, 2012   Kidney stone    Mitral regurgitation    Mild, echo, September, 2013   Prostate cancer ([place])    SCCA (squamous cell carcinoma) of skin 04/12/2020   medial scalp(CX35FU)  PAST SURGICAL HISTORY: Past Surgical History:  Procedure Laterality Date   BACK SURGERY     PROSTATE BIOPSY      FAMILY HISTORY:  Family History  Problem Relation Age of Onset   Breast cancer Mother    Prostate cancer Neg Hx    Colon cancer Neg Hx    Pancreatic cancer Neg Hx     SOCIAL HISTORY:  Social History   Socioeconomic History   Marital status: Married    Spouse name: Not on file   Number of children: 2   Years of education: Not on file   Highest education level: Not on file  Occupational History   Not on file  Tobacco  Use   Smoking status: Former    Packs/day: 0.30    Years: 5.00    Pack years: 1.50    Types: Cigarettes    Quit date: 11/27/1974    Years since quitting: 47.1   Smokeless tobacco: Never  Vaping Use   Vaping Use: Never used  Substance and Sexual Activity   Alcohol use: Yes   Drug use: No   Sexual activity: Yes  Other Topics Concern   Not on file  Social History Narrative   Not on file   Social Determinants of Health   Financial Resource Strain: Not on file  Food Insecurity: Not on file  Transportation Needs: Not on file  Physical Activity: Not on file  Stress: Not on file  Social Connections: Not on file  Intimate Partner Violence: Not on file    ALLERGIES: Patient has no known allergies.  MEDICATIONS:  Current Outpatient Medications  Medication Sig Dispense Refill   Fluorouracil (TOLAK) 4 % CREA Apply 1 application topically at bedtime. 40 g 0   Multiple Vitamins-Minerals (MULTIVITAMIN WITH MINERALS) tablet Take 1 tablet by mouth daily.     No current facility-administered medications for this encounter.    REVIEW OF SYSTEMS:  On review of systems, the patient reports that he is doing well overall. He denies any chest pain, shortness of breath, cough, fevers, chills, night sweats, unintended weight changes. He denies any bowel disturbances, and denies abdominal pain, nausea or vomiting. He denies any new musculoskeletal or joint aches or pains. His IPSS was 1, indicating minimal urinary symptoms. His SHIM was 17, indicating he has moderate erectile dysfunction.He remains very physically active and reports that sexual activity is a low priority for him at this point in his life. A complete review of systems is obtained and is otherwise negative.    PHYSICAL EXAM:  Wt Readings from Last 3 Encounters:  12/30/21 207 lb (93.9 kg)  02/01/21 204 lb 4 oz (92.6 kg)  09/19/12 201 lb (91.2 kg)   Temp Readings from Last 3 Encounters:  12/30/21 97.8 F (36.6 C) (Temporal)   02/01/21 (!) 96.8 F (36 C) (Temporal)  12/21/17 97.7 F (36.5 C)   BP Readings from Last 3 Encounters:  12/30/21 128/68  02/01/21 (!) 155/85  12/21/17 131/67   Pulse Readings from Last 3 Encounters:  12/30/21 (!) 51  02/01/21 (!) 57  12/21/17 (!) 55   Pain Assessment Pain Score: 0-No pain/10  In general this is a well appearing Caucasian male man in no acute distress. He's alert and oriented x4 and appropriate throughout the examination. Cardiopulmonary assessment is negative for acute distress, and he exhibits normal effort.     KPS = 100  100 - Normal; no complaints; no evidence of disease. 90   - Able to  carry on normal activity; minor signs or symptoms of disease. 80   - Normal activity with effort; some signs or symptoms of disease. 65   - Cares for self; unable to carry on normal activity or to do active work. 60   - Requires occasional assistance, but is able to care for most of his personal needs. 50   - Requires considerable assistance and frequent medical care. 45   - Disabled; requires special care and assistance. 19   - Severely disabled; hospital admission is indicated although death not imminent. 24   - Very sick; hospital admission necessary; active supportive treatment necessary. 10   - Moribund; fatal processes progressing rapidly. 0     - Dead  Karnofsky DA, Abelmann Parks, Craver LS and Burchenal [facility] [phone]) The use of the nitrogen mustards in the palliative treatment of carcinoma: with particular reference to bronchogenic carcinoma Cancer 1 634-56  LABORATORY DATA:  Lab Results  Component Value Date   WBC 6.5 03/15/2008   HGB 13.8 DELTA CHECK NOTED 03/15/2008   HCT 40.0 03/15/2008   MCV 90.7 03/15/2008   PLT 118 (L) 03/15/2008   Lab Results  Component Value Date   NA 141 03/15/2008   K 4.0 03/15/2008   CL 106 03/15/2008   CO2 28 03/15/2008   Lab Results  Component Value Date   ALT 16 03/15/2008   AST 20 03/15/2008   ALKPHOS 51 03/15/2008    BILITOT 0.4 03/15/2008     RADIOGRAPHY: MR PROSTATE W WO CONTRAST  Result Date: 12/05/2021 CLINICAL DATA:  PSA level of 5.42, history of prostate cancer with Gleason 3+4=7 prostate adenocarcinoma involving the previous left-sided targeted region of interest. EXAM: MR PROSTATE WITHOUT AND WITH CONTRAST TECHNIQUE: Multiplanar multisequence MRI images were obtained of the pelvis centered about the prostate. Pre and post contrast images were obtained. CONTRAST:  15 mL MULTIHANCE GADOBENATE DIMEGLUMINE 529 MG/ML IV SOLN COMPARISON:  MRI 11/05/2020 FINDINGS: Prostate: Region of interest # 1: PI-RADS category 4 lesion of the left posterolateral peripheral zone in the mid gland with focally reduced T2 signal (image 43, series 9) and, focal early enhancement (image 172, series 12). This measures 0.19 cc (0.8 by 0.5 by 0.6 cm), roughly stable compared to measurements from 11/05/2020. Region of interest # 2: PI-RADS category 3 lesion of the left anterior peripheral zone in the mid gland, with focally reduced T2 signal (image 31, series 9) without substantial focal early enhancement or restricted diffusion. This measures 0.24 cc (1.1 by 0.5 by 0.6 cm) and is mildly more pronounced than on the prior exam. There is some minimal linear banding in the right anterior peripheral zone in the mid gland, likely postinflammatory. Volume: 3D volumetric assessment: Prostate volume 34.81 cc (4.4 by 3.8 by 4.2 cm). Transcapsular spread:  Absent Seminal vesicle involvement: Absent Neurovascular bundle involvement: Absent Pelvic adenopathy: Absent Bone metastasis: Absent Other findings: No supplemental non-categorized findings. IMPRESSION: 1. No substantial change in size of the PI-RADS category 4 lesion of the left posterolateral peripheral zone in the mid gland, previous biopsy proven to be Gleason 3+4=7 adenocarcinoma. 2. Mildly increased conspicuity of a PI-RADS category 3 lesion of the left anterior peripheral zone in the mid gland.  3. Targeting data sent to [place]. Electronically Signed   By: Van Clines M.D.   On: 12/05/2021 10:35      IMPRESSION/PLAN: 1. 71 y.o. gentleman with Stage T1c adenocarcinoma of the prostate with Gleason Score of 3+4, and PSA of 6.72. We discussed  the patient's workup and reviewed his recent labs and MRI prostate imaging. Based on the patient's T stage, Gleason's score, and PSA, he remains in the favorable intermediate risk group with low volume disease. Accordingly, he is eligible for a variety of potential treatment options including brachytherapy, 5.5 weeks of external radiation, or prostatectomy. We also discussed the appropriateness of continued active surveillence, off guideline protocol, but given his small volume, favorable intermediate risk disease, without any indication of disease progression on MRI, we feel it remains an appropriate option. We reviewed the available radiation techniques, and focused on the details and logistics of delivery. We discussed and outlined the risks, benefits, short and long-term effects associated with radiotherapy and compared and contrasted these with prostatectomy. We discussed the role of SpaceOAR gel in reducing the rectal toxicity associated with radiotherapy.  He and his wife appear to have a good understanding of his disease and our treatment recommendations which are of curative intent.  They were encouraged to ask questions that were answered to their stated satisfaction.  At the conclusion of our conversation, the patient is most interested in continuing in active surveillance but reports that if/when the time comes that he needs to move forward with treatment, he remains most interested in pursuing brachytherapy. We will share our discussion with Dr. Gloriann Loan and look forward to following along the progress of this very nice gentleman.He is currently scheduled for a repeat PSA and MRI prostate in 04/2022 and we would recommend surveillance MRI fusion  prostate biopsy at that time to update his pathology and assess the appropriateness for continued AS versus proceeding with treatment at that time. They know that they are welcome to call at any time with any further questions or concerns regarding radiation.    Nicholos Johns, PA-C    Tyler Pita, MD  [facility] Direct Dial: [phone]   Fax: [phone] [email or web address]   Skype   LinkedIn   This document serves as a record of services personally performed by Tyler Pita, MD and Freeman Caldron, PA-C. It was created on their behalf by Wilburn Mylar, a trained medical scribe. The creation of this record is based on the scribe's personal observations and the provider's statements to them. This document has been checked and approved by the attending provider.

## 2021-12-30 NOTE — Progress Notes (Signed)
Spoke w/ patient, verified identity, and begin nursing interview; w/ spouse 'Jeryn Cerney' in attendance. Patient states "Doing well." No symptoms reported at this time.  Meaningful use complete. No urinary management medications at this time. Urology appointment- February 23rd w/ [facility]  BP 128/68 (BP Location: Right Arm, Patient Position: Sitting, Cuff Size: Normal)    Pulse (!) 51    Temp 97.8 F (36.6 C) (Temporal)    Resp 20    Ht 6' (1.829 m)    Wt 207 lb (93.9 kg)    SpO2 98%    BMI 28.07 kg/m

## 2022-01-04 ENCOUNTER — Telehealth: Payer: Self-pay | Admitting: Dermatology

## 2022-01-04 NOTE — Telephone Encounter (Signed)
Just about finished with the Tolak. Wants to know what to do when finished---it was a 28 day  treatment. (Still has 4-5 days to go). ST patient

## 2022-01-04 NOTE — Telephone Encounter (Signed)
Phone call to patient to answer his question about Tolak. Voicemail left for patient to give the office a call back.

## 2022-01-09 NOTE — Telephone Encounter (Signed)
Phone call to patient regarding his phone call about the [place]. Voicemail left for patient to give the office a call back.

## 2022-01-10 ENCOUNTER — Telehealth: Payer: Self-pay | Admitting: Dermatology

## 2022-01-10 NOTE — Telephone Encounter (Signed)
[place] office, will advise patient to send photo via my chart.

## 2022-01-10 NOTE — Telephone Encounter (Signed)
Patient left message on office voice mail saying that he has finished the 28 day treatment of Tolak and would like to speak with Lavonna Monarch, M.D. about the results.

## 2022-01-11 NOTE — Telephone Encounter (Signed)
Phone call from patient stating that he completed his treatment of Tolak on yesterday and he wanted to speak with Dr. Denna Haggard about his results and he also wanted to know what he should do next? I advised patient that he could send [place] a picture via MyChart and he would need to schedule a clinical follow up. Patient aware, follow up appointment scheduled.

## 2022-03-27 ENCOUNTER — Ambulatory Visit (INDEPENDENT_AMBULATORY_CARE_PROVIDER_SITE_OTHER): Payer: Medicare Other | Admitting: Dermatology

## 2022-03-27 ENCOUNTER — Encounter: Payer: Self-pay | Admitting: Dermatology

## 2022-03-27 DIAGNOSIS — L570 Actinic keratosis: Secondary | ICD-10-CM

## 2022-03-27 NOTE — Progress Notes (Signed)
? ?  Follow-Up Visit ?  ?Subjective  ?Nazir Hacker is a 71 y.o. male who presents for the following: Follow-up (F/u on scalp - great reaction- good peel). ? ?Actinic keratoses, some response to topical Tolak ?Location:  ?Duration:  ?Quality:  ?Associated Signs/Symptoms: ?Modifying Factors:  ?Severity:  ?Timing: ?Context:  ? ?Objective  ?Well appearing patient in no apparent distress; mood and affect are within normal limits. ?Head - Anterior (Face) ?Patient did not have severe reaction to [facility] and there remain a number of nonhypertrophic actinic keratoses.  No uncovered skin cancer.  No freezing done today.  Will decide next winter if we need to repeat a topical therapy or PDT. ? ? ? ?Head and neck examined today. ? ? ?Assessment & Plan  ? ? ?AK (actinic keratosis) ?Head - Anterior (Face) ? ?Possible repeat tolak in 6 months  ? ?Related Medications ?Fluorouracil (TOLAK) 4 % CREA ?Apply 1 application topically at bedtime. ? ? ? ? ? ?I, Lavonna Monarch, MD, have reviewed all documentation for this visit.  The documentation on 03/27/22 for the exam, diagnosis, procedures, and orders are all accurate and complete. ?

## 2022-04-18 ENCOUNTER — Ambulatory Visit: Payer: Medicare Other | Admitting: Dermatology

## 2022-05-01 ENCOUNTER — Ambulatory Visit: Payer: Medicare Other | Admitting: Dermatology

## 2022-09-21 ENCOUNTER — Other Ambulatory Visit: Payer: Self-pay | Admitting: Urology

## 2022-09-21 DIAGNOSIS — C61 Malignant neoplasm of prostate: Secondary | ICD-10-CM

## 2022-10-27 ENCOUNTER — Ambulatory Visit
Admission: RE | Admit: 2022-10-27 | Discharge: 2022-10-27 | Disposition: A | Discharge status: Home / Self Care | Payer: Medicare Other | Source: Ambulatory Visit | Attending: Urology | Admitting: Urology

## 2022-10-27 DIAGNOSIS — C61 Malignant neoplasm of prostate: Secondary | ICD-10-CM

## 2022-10-27 MED ORDER — GADOPICLENOL 0.5 MMOL/ML IV SOLN
10.0000 mL | Freq: Once | INTRAVENOUS | Status: AC | PRN
  Administered 2022-10-27: 10 mL via INTRAVENOUS

## 2022-10-30 ENCOUNTER — Ambulatory Visit: Payer: Medicare Other | Admitting: Dermatology

## 2022-12-11 ENCOUNTER — Other Ambulatory Visit: Payer: Self-pay | Admitting: Urology

## 2022-12-13 ENCOUNTER — Encounter (HOSPITAL_BASED_OUTPATIENT_CLINIC_OR_DEPARTMENT_OTHER): Payer: Self-pay | Admitting: Urology

## 2022-12-13 NOTE — Progress Notes (Signed)
Pre procedure ESWL instructions discussed with patient.  Patient will arrive at 8:00 a.m. his caregiver will be his wife Vaun Hyndman.

## 2022-12-13 NOTE — Progress Notes (Signed)
Left message requesting call back in regard to upcoming ESWL.

## 2022-12-15 ENCOUNTER — Other Ambulatory Visit: Payer: Self-pay

## 2022-12-15 ENCOUNTER — Encounter (HOSPITAL_BASED_OUTPATIENT_CLINIC_OR_DEPARTMENT_OTHER): Payer: Self-pay | Admitting: Urology

## 2022-12-15 ENCOUNTER — Encounter (HOSPITAL_BASED_OUTPATIENT_CLINIC_OR_DEPARTMENT_OTHER)
Admission: RE | Disposition: A | Discharge status: Home / Self Care | Payer: Self-pay | Source: Home / Self Care | Attending: Urology

## 2022-12-15 ENCOUNTER — Ambulatory Visit (HOSPITAL_BASED_OUTPATIENT_CLINIC_OR_DEPARTMENT_OTHER)
Admission: RE | Admit: 2022-12-15 | Discharge: 2022-12-15 | Disposition: A | Discharge status: Home / Self Care | Payer: Medicare Other | Attending: Urology | Admitting: Urology

## 2022-12-15 ENCOUNTER — Ambulatory Visit (HOSPITAL_COMMUNITY): Payer: Medicare Other

## 2022-12-15 DIAGNOSIS — N201 Calculus of ureter: Secondary | ICD-10-CM | POA: Diagnosis present

## 2022-12-15 DIAGNOSIS — I10 Essential (primary) hypertension: Secondary | ICD-10-CM | POA: Diagnosis not present

## 2022-12-15 DIAGNOSIS — Z87891 Personal history of nicotine dependence: Secondary | ICD-10-CM | POA: Diagnosis not present

## 2022-12-15 DIAGNOSIS — Z8546 Personal history of malignant neoplasm of prostate: Secondary | ICD-10-CM | POA: Diagnosis not present

## 2022-12-15 HISTORY — DX: Essential (primary) hypertension: I10

## 2022-12-15 HISTORY — DX: Other complications of anesthesia, initial encounter: T88.59XA

## 2022-12-15 HISTORY — PX: EXTRACORPOREAL SHOCK WAVE LITHOTRIPSY: SHX1557

## 2022-12-15 SURGERY — LITHOTRIPSY, ESWL
Anesthesia: LOCAL | Laterality: Left

## 2022-12-15 MED ORDER — DIPHENHYDRAMINE HCL 25 MG PO CAPS
25.0000 mg | ORAL_CAPSULE | ORAL | Status: AC
  Administered 2022-12-15: 25 mg via ORAL

## 2022-12-15 MED ORDER — DIAZEPAM 5 MG PO TABS
10.0000 mg | ORAL_TABLET | ORAL | Status: AC
  Administered 2022-12-15: 10 mg via ORAL

## 2022-12-15 MED ORDER — SODIUM CHLORIDE 0.9 % IV SOLN
INTRAVENOUS | Status: DC
  Administered 2022-12-15: 1000 mL via INTRAVENOUS

## 2022-12-15 MED ORDER — CIPROFLOXACIN HCL 500 MG PO TABS
500.0000 mg | ORAL_TABLET | ORAL | Status: AC
  Administered 2022-12-15: 500 mg via ORAL

## 2022-12-15 MED ORDER — CIPROFLOXACIN HCL 500 MG PO TABS
ORAL_TABLET | ORAL | Status: AC
  Filled 2022-12-15: qty 1

## 2022-12-15 MED ORDER — DIAZEPAM 5 MG PO TABS
ORAL_TABLET | ORAL | Status: AC
  Filled 2022-12-15: qty 2

## 2022-12-15 MED ORDER — DIPHENHYDRAMINE HCL 25 MG PO CAPS
ORAL_CAPSULE | ORAL | Status: AC
  Filled 2022-12-15: qty 1

## 2022-12-15 MED ORDER — HYDROCODONE-ACETAMINOPHEN 5-325 MG PO TABS: 1.0000 | ORAL_TABLET | ORAL | 0 refills | Status: AC | PRN

## 2022-12-15 NOTE — Op Note (Signed)
See [facility] OP note scanned into chart. Also because of the size, density, location and other factors that cannot be anticipated I feel this will likely be a staged procedure. This fact supersedes any indication in the scanned [facility] operative note to the contrary.  

## 2022-12-15 NOTE — H&P (Signed)
See scanned H&P 

## 2022-12-18 ENCOUNTER — Encounter (HOSPITAL_BASED_OUTPATIENT_CLINIC_OR_DEPARTMENT_OTHER): Payer: Self-pay | Admitting: Urology

## 2023-06-26 IMAGING — MR MR PROSTATE WO/W CM
12 series · 48 of 48 positions shown · IV contrast (multihance)
Comparison: MRI 11/05/2020

CLINICAL DATA: PSA level of 5.42, history of prostate cancer with
Gleason 3+4=7 prostate adenocarcinoma involving the previous
left-sided targeted region of interest.

EXAM:
MR PROSTATE WITHOUT AND WITH CONTRAST
TECHNIQUE: Multiplanar multisequence MRI images were obtained of the pelvis
centered about the prostate. Pre and post contrast images were
obtained.
CONTRAST:  15 mL MULTIHANCE GADOBENATE DIMEGLUMINE 529 MG/ML IV SOLN

[Series 3: T2 · coronal · 3.0mm · 0.56mm/px · 1 of 23 slices shown (1 of 3)]
[im 1/23]
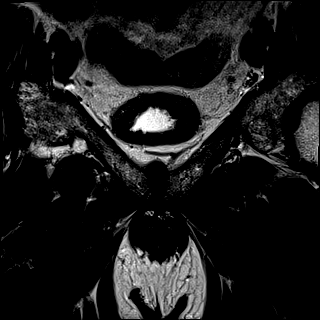

[Series 4: T1 · axial · 5.0mm · 1.25mm/px · z∈[-93,+102]mm · 2 of 80 slices shown]
[im 1/80]
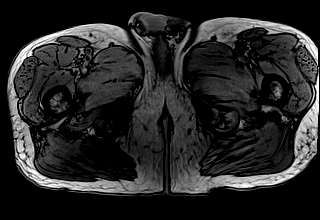
[im 80/80]
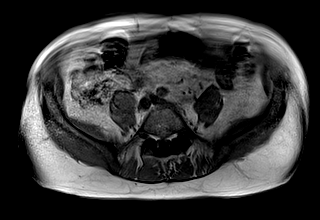

[Series 5: DWI · axial · 3.0mm · 1.75mm/px · z∈[-72,-6]mm · 2 of 69 slices shown (1 of 3)]
[im 1/69]
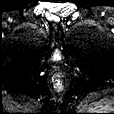
[im 69/69]
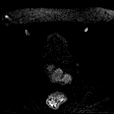

[Series 6: DWI · axial · 3.0mm · 1.75mm/px · 1 of 23 slices shown (2 of 3)]
[im 1/23]
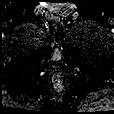

[Series 7: DWI · axial · 3.0mm · 1.75mm/px · 1 of 23 slices shown (3 of 3)]
[im 1/23]
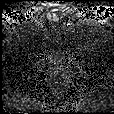

[Series 8: T2 · axial · 3.0mm · 0.56mm/px · 1 of 23 slices shown (2 of 3)]
[im 1/23]
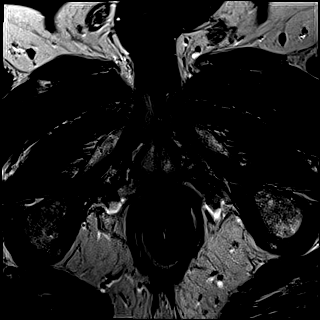

[Series 9: T2 · axial · 1.0mm · 1.04mm/px · z∈[-75,-4]mm · 2 of 72 slices shown (3 of 3)]
[im 1/72]
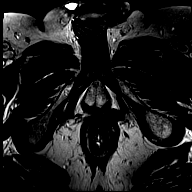
[im 72/72]
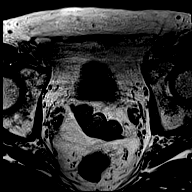

[Series 10: pre t1_twist_tra_dyn · axial · non-contrast · 3.5mm · 0.83mm/px · 1 of 20 slices shown]
[im 1/20]
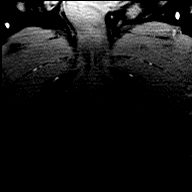

[Series 11: post t1_twist_tra_dyn-copy center · axial · non-contrast · 3.5mm · 0.83mm/px · z∈[-70,-3]mm · 17 of 600 slices shown]
[im 1/600]
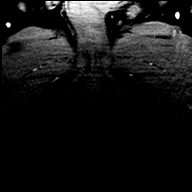
[im 38/600]
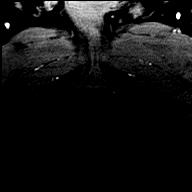
[im 75/600]
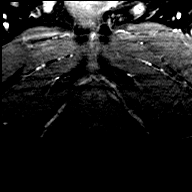
[im 113/600]
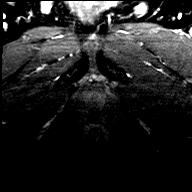
[im 150/600]
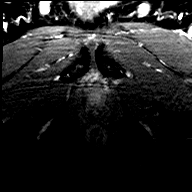
[im 188/600]
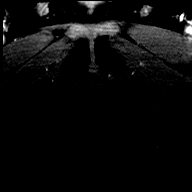
[im 225/600]
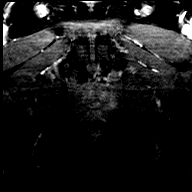
[im 263/600]
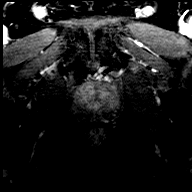
[im 300/600]
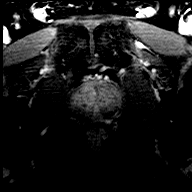
[im 337/600]
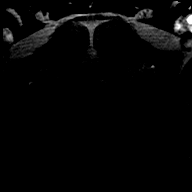
[im 375/600]
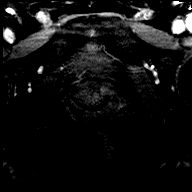
[im 412/600]
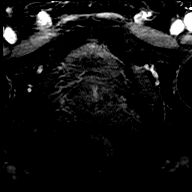
[im 450/600]
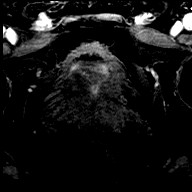
[im 487/600]
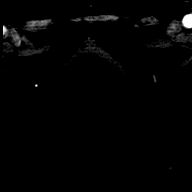
[im 525/600]
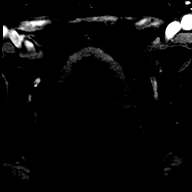
[im 562/600]
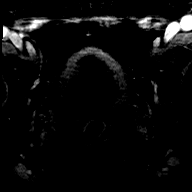
[im 600/600]
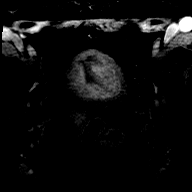

[Series 12: post t1_twist_tra_dyn-copy cent_sub · axial · 3.5mm · 0.83mm/px · z∈[-70,-3]mm · 16 of 567 slices shown]
[im 1/567]
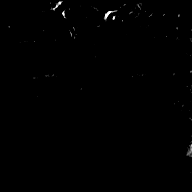
[im 38/567]
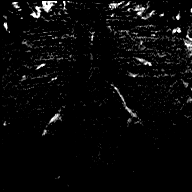
[im 76/567]
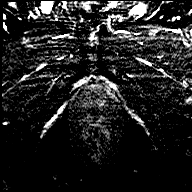
[im 114/567]
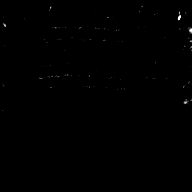
[im 151/567]
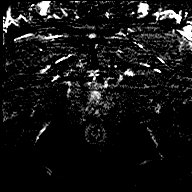
[im 189/567]
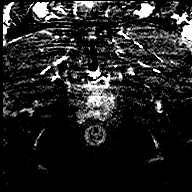
[im 227/567]
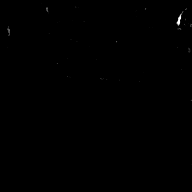
[im 265/567]
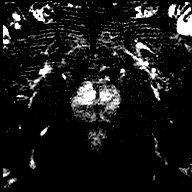
[im 302/567]
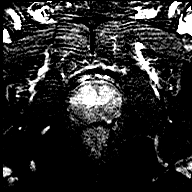
[im 340/567]
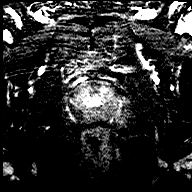
[im 378/567]
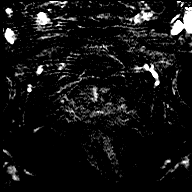
[im 416/567]
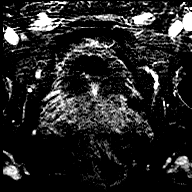
[im 453/567]
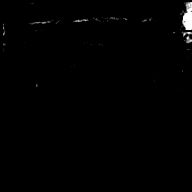
[im 491/567]
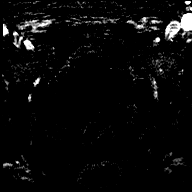
[im 529/567]
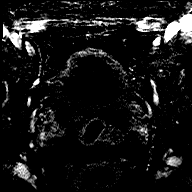
[im 567/567]
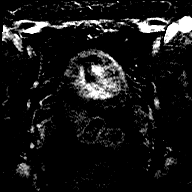

[Series 13: t1_vibe_dixon_tra_f · axial · 2.5mm · 0.91mm/px · z∈[-94,+104]mm · 2 of 80 slices shown]
[im 1/80]
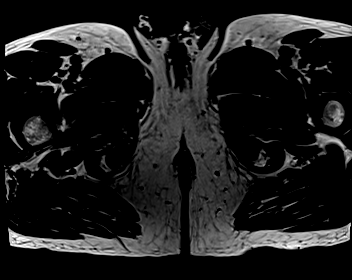
[im 80/80]
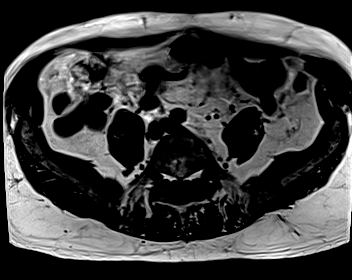

[Series 14: t1_vibe_dixon_tra_w · axial · 2.5mm · 0.91mm/px · z∈[-94,+104]mm · 2 of 80 slices shown]
[im 1/80]
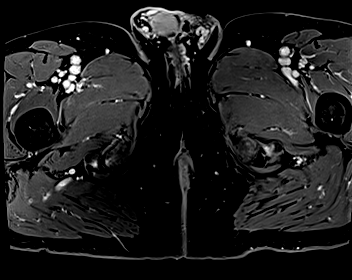
[im 80/80]
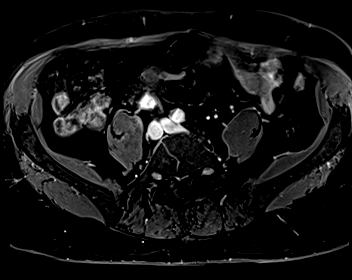

[48 of 48 positions shown; findings below may reference images not displayed]

FINDINGS: Prostate: Region of interest # 1: PI-RADS category 4 lesion of the
left posterolateral peripheral zone in the mid gland with focally
reduced T2 signal (image 43, series 9) and, focal early enhancement
(image 172, series 12). This measures 0.19 cc (0.8 by 0.5 by
cm), roughly stable compared to measurements from 11/05/2020.

Region of interest # 2: PI-RADS category 3 lesion of the left
anterior peripheral zone in the mid gland, with focally reduced T2
signal (image 31, series 9) without substantial focal early
enhancement or restricted diffusion. This measures 0.24 cc (1.1 by
0.5 by 0.6 cm) and is mildly more pronounced than on the prior exam.

There is some minimal linear banding in the right anterior
peripheral zone in the mid gland, likely postinflammatory.

Volume: 3D volumetric assessment: Prostate volume 34.81 cc (4.4 by
3.8 by 4.2 cm).

Transcapsular spread:  Absent

Seminal vesicle involvement: Absent

Neurovascular bundle involvement: Absent

Pelvic adenopathy: Absent

Bone metastasis: Absent

Other findings: No supplemental non-categorized findings.
IMPRESSION: 1. No substantial change in size of the PI-RADS category 4 lesion of
the left posterolateral peripheral zone in the mid gland, previous
biopsy proven to be Gleason 3+4=7 adenocarcinoma.
2. Mildly increased conspicuity of a PI-RADS category 3 lesion of
the left anterior peripheral zone in the mid gland.
3. Targeting data sent to [facility].

## 2023-07-13 ENCOUNTER — Encounter (HOSPITAL_COMMUNITY): Payer: Self-pay

## 2023-07-13 ENCOUNTER — Other Ambulatory Visit: Payer: Self-pay

## 2023-07-13 ENCOUNTER — Emergency Department (HOSPITAL_COMMUNITY)
Admission: EM | Admit: 2023-07-13 | Discharge: 2023-07-13 | Disposition: A | Discharge status: Home / Self Care | Payer: Medicare Other | Attending: Emergency Medicine | Admitting: Emergency Medicine

## 2023-07-13 DIAGNOSIS — I10 Essential (primary) hypertension: Secondary | ICD-10-CM | POA: Insufficient documentation

## 2023-07-13 DIAGNOSIS — S61211A Laceration without foreign body of left index finger without damage to nail, initial encounter: Secondary | ICD-10-CM | POA: Diagnosis not present

## 2023-07-13 DIAGNOSIS — W268XXA Contact with other sharp object(s), not elsewhere classified, initial encounter: Secondary | ICD-10-CM | POA: Diagnosis not present

## 2023-07-13 DIAGNOSIS — S6992XA Unspecified injury of left wrist, hand and finger(s), initial encounter: Secondary | ICD-10-CM | POA: Diagnosis present

## 2023-07-13 DIAGNOSIS — Z79899 Other long term (current) drug therapy: Secondary | ICD-10-CM | POA: Diagnosis not present

## 2023-07-13 MED ORDER — LIDOCAINE HCL (PF) 1 % IJ SOLN
5.0000 mL | Freq: Once | INTRAMUSCULAR | Status: AC
  Administered 2023-07-13: 5 mL
  Filled 2023-07-13: qty 5

## 2023-07-13 MED ORDER — CEPHALEXIN 500 MG PO CAPS: 500.0000 mg | ORAL_CAPSULE | Freq: Four times a day (QID) | ORAL | 0 refills | Status: DC

## 2023-07-13 NOTE — Discharge Instructions (Addendum)
You were seen in the emergency department for your finger laceration.  I did remove some of the urgent care sutures and placed additional sutures here.  You have a total of 8 sutures in your finger.  You should keep your dressing on for the next 24 hours and then you can shower and wash your hands normally.  You can dress her finger with antibiotic ointment and the dressing is needed or you can keep it open to air however I would recommend keeping it covered while you are working to prevent infection.  I have given you a prescription of antibiotics and you do not need to start this unless if you start to develop some redness around your finger.  You should follow-up with your primary doctor or with the hand surgeon to have your finger rechecked and for suture removal in 7 to 10 days.  You should return to the emergency department if you are having spreading redness from your finger, diffuse swelling of your finger, you are unable to bend your finger, you have fevers or if you have any other new or concerning symptoms.

## 2023-07-13 NOTE — ED Triage Notes (Signed)
Pt came in via POV from [facility] d/t cutting his Lt index finger on a hedge trimmer around 1500 today. The provider there did an xray & it was told that it was fine & they gave him ABT & attempted to stitch the finger but the entirety of the edges weren't straight enough to complete so he was sent here for eval with a specialist that can finish the laceration repair. A/Ox4, denies pain in triage d/t it being numbed while at [facility] & lac is covered with drg from [facility].

## 2023-07-13 NOTE — ED Provider Notes (Signed)
EMERGENCY DEPARTMENT AT [facility] Provider Note   CSN: [id number] Arrival date & time: 07/13/23  1819     History  Chief Complaint  Patient presents with   Finger Lac    Brandon Goodman is a 72 y.o. male.  Patient is a 72 year old male presenting to the emergency department with finger laceration.  He reports he was hedge trimming earlier in the day and cut his left index finger on the trimming blade.  He was initially seen at urgent care who attempted laceration repair but was referred to the ED due to his complex laceration.  X-ray was performed there that showed no fracture or foreign body.  Patient had his finger numbed at urgent care and reports that it is still numb but denied any numbness prior to this.  He denies any weakness.  He states that his tetanus is up-to-date and he was given prophylactic antibiotics at urgent care.  The history is provided by the patient and the spouse.       Home Medications Prior to Admission medications   Medication Sig Start Date End Date Taking? Authorizing Provider  amLODipine-benazepril (LOTREL) 5-20 MG capsule Take 1 capsule by mouth daily.    [provider]  Fluorouracil (TOLAK) 4 % CREA Apply 1 application topically at bedtime. Patient not taking: Reported on 03/27/2022 10/26/21   Janalyn Harder, MD  HYDROcodone-acetaminophen (NORCO/VICODIN) 5-325 MG tablet Take 1 tablet by mouth every 4 (four) hours as needed for moderate pain. 12/15/22 12/15/23  Crista Elliot, MD  Multiple Vitamins-Minerals (MULTIVITAMIN WITH MINERALS) tablet Take 1 tablet by mouth daily.    [provider]  tamsulosin (FLOMAX) 0.4 MG CAPS capsule Take 0.4 mg by mouth.    [provider]      Allergies    Patient has no known allergies.    Review of Systems   Review of Systems  Physical Exam Updated Vital Signs BP 125/74 (BP Location: Right Arm)   Pulse (!) 49   Temp 98.5 F (36.9 C)   Resp 17   Ht 6'  (1.829 m)   Wt 93 kg   SpO2 100%   BMI 27.80 kg/m  Physical Exam Vitals and nursing note reviewed.  Constitutional:      General: He is not in acute distress.    Appearance: Normal appearance.  HENT:     Head: Normocephalic.     Nose: Nose normal.  Eyes:     Extraocular Movements: Extraocular movements intact.  Cardiovascular:     Rate and Rhythm: Normal rate.     Pulses: Normal pulses.  Pulmonary:     Effort: Pulmonary effort is normal.  Abdominal:     General: Abdomen is flat.  Musculoskeletal:        General: Normal range of motion.     Cervical back: Normal range of motion.     Comments: Flexion/extension intact in left index finger  Skin:    General: Skin is warm and dry.     Comments: Complex jagged appearing laceration to dorsum of distal tip of left index finger with multiple skin flaps, no nailbed involvement  Neurological:     General: No focal deficit present.     Mental Status: He is alert and oriented to person, place, and time.  Psychiatric:        Mood and Affect: Mood normal.        Behavior: Behavior normal.     ED Results / Procedures /  Treatments   Labs (all labs ordered are listed, but only abnormal results are displayed) Labs Reviewed - No data to display  EKG None  Radiology No results found.  Procedures .Marland KitchenLaceration Repair  Date/Time: 07/13/2023 9:36 PM  Performed by: Rexford Maus, DO Authorized by: Rexford Maus, DO   Consent:    Consent obtained:  Verbal   Consent given by:  Patient   Risks, benefits, and alternatives were discussed: yes     Risks discussed:  Infection, need for additional repair, pain, poor cosmetic result, poor wound healing and nerve damage   Alternatives discussed:  No treatment and delayed treatment Universal protocol:    Procedure explained and questions answered to patient or proxy's satisfaction: yes     Patient identity confirmed:  Verbally with patient Anesthesia:    Anesthesia method:   None (Patient received nerve block at urgent care, finger still numb on my evaluation) Laceration details:    Location:  Finger   Finger location:  L index finger   Length (cm):  2   Depth (mm):  1 Exploration:    Limited defect created (wound extended): yes     Hemostasis achieved with:  Direct pressure   Imaging obtained comment:  Reviewed XR from urgent care, no noted foreign body   Wound exploration: wound explored through full range of motion and entire depth of wound visualized     Wound extent: areolar tissue not violated, fascia not violated, no foreign body, no signs of injury, no nerve damage, no tendon damage, no underlying fracture and no vascular damage     Contaminated: no   Treatment:    Area cleansed with:  Saline   Amount of cleaning:  Standard   Irrigation solution:  Sterile saline   Visualized foreign bodies/material removed: no     Debridement:  None   Undermining:  None   Scar revision: no   Skin repair:    Repair method:  Sutures   Suture size:  4-0   Suture material:  Prolene   Suture technique:  Simple interrupted   Number of sutures:  6 Approximation:    Approximation:  Close Repair type:    Repair type:  Complex Post-procedure details:    Dressing:  Bulky dressing   Procedure completion:  Tolerated well, no immediate complications Comments:     3 sutures removed that were placed earlier in the day at urgent care prior to my laceration repair.     Medications Ordered in ED Medications  lidocaine (PF) (XYLOCAINE) 1 % injection 5 mL (has no administration in time range)    ED Course/ Medical Decision Making/ A&P                                 Medical Decision Making This patient presents to the ED with chief complaint(s) of finger laceration with pertinent past medical history of HTN which further complicates the presenting complaint. The complaint involves an extensive differential diagnosis and also carries with it a high risk of  complications and morbidity.    The differential diagnosis includes laceration, no evidence of fracture or foreign body, no evidence of infection   Additional history obtained: Additional history obtained from spouse Records reviewed Care Everywhere/External Records - urgent care records  ED Course and Reassessment: On patient's arrival he is hemodynamically stable in no acute distress.  Bleeding is well-controlled with his finger.  He does have  multiple flaps and I will attempt repair by myself.  I did review x-ray read from urgent care that no evidence of fracture or foreign body and no foreign body was visualized on my exam.  Please see my procedure note.  The patient was given prophylactic antibiotics by urgent care and requested a prescription.  I informed the patient and wife about lack of evidence supporting prophylactic antibiotics and lacerations due to an upcoming travel we will give them a new prescription they can take should he start to develop signs of infection.  They will be given outpatient follow-up for wound check and suture removal.  Independent labs interpretation:  N/A  Independent visualization of imaging: -N/A  Consultation: - Consulted or discussed management/test interpretation w/ external professional: N/A  Consideration for admission or further workup: Patient has no emergent conditions requiring admission or further work-up at this time and is stable for discharge home with primary care follow-up  Social Determinants of health: N/A    Risk Prescription drug management.          Final Clinical Impression(s) / ED Diagnoses Final diagnoses:  Laceration of left index finger without foreign body without damage to nail, initial encounter    Rx / DC Orders ED Discharge Orders     None         Rexford Maus, DO 07/13/23 2146

## 2023-09-04 ENCOUNTER — Other Ambulatory Visit (HOSPITAL_BASED_OUTPATIENT_CLINIC_OR_DEPARTMENT_OTHER): Payer: Self-pay | Admitting: Internal Medicine

## 2023-09-04 DIAGNOSIS — E785 Hyperlipidemia, unspecified: Secondary | ICD-10-CM

## 2023-09-04 DIAGNOSIS — I10 Essential (primary) hypertension: Secondary | ICD-10-CM

## 2023-10-12 ENCOUNTER — Ambulatory Visit (HOSPITAL_BASED_OUTPATIENT_CLINIC_OR_DEPARTMENT_OTHER)
Admission: RE | Admit: 2023-10-12 | Discharge: 2023-10-12 | Disposition: A | Discharge status: Home / Self Care | Payer: Medicare Other | Source: Ambulatory Visit | Attending: Internal Medicine | Admitting: Internal Medicine

## 2023-10-12 DIAGNOSIS — E785 Hyperlipidemia, unspecified: Secondary | ICD-10-CM | POA: Insufficient documentation

## 2023-10-12 DIAGNOSIS — I10 Essential (primary) hypertension: Secondary | ICD-10-CM | POA: Insufficient documentation

## 2023-11-14 ENCOUNTER — Other Ambulatory Visit: Payer: Self-pay | Admitting: Urology

## 2023-11-14 DIAGNOSIS — C61 Malignant neoplasm of prostate: Secondary | ICD-10-CM

## 2023-12-07 ENCOUNTER — Ambulatory Visit
Admission: RE | Admit: 2023-12-07 | Discharge: 2023-12-07 | Disposition: A | Discharge status: Home / Self Care | Payer: Medicare Other | Source: Ambulatory Visit | Attending: Urology

## 2023-12-07 DIAGNOSIS — C61 Malignant neoplasm of prostate: Secondary | ICD-10-CM

## 2023-12-07 MED ORDER — GADOPICLENOL 0.5 MMOL/ML IV SOLN
10.0000 mL | Freq: Once | INTRAVENOUS | Status: AC | PRN
  Administered 2023-12-07: 10 mL via INTRAVENOUS

## 2023-12-19 ENCOUNTER — Ambulatory Visit: Payer: Medicare Other | Admitting: Cardiology

## 2023-12-27 ENCOUNTER — Ambulatory Visit: Payer: Medicare Other | Attending: Cardiology | Admitting: Cardiology

## 2023-12-27 ENCOUNTER — Encounter: Payer: Self-pay | Admitting: Cardiology

## 2023-12-27 VITALS — BP 144/70 | HR 52 | Ht 72.0 in | Wt 204.6 lb

## 2023-12-27 DIAGNOSIS — R0602 Shortness of breath: Secondary | ICD-10-CM | POA: Insufficient documentation

## 2023-12-27 DIAGNOSIS — I10 Essential (primary) hypertension: Secondary | ICD-10-CM | POA: Diagnosis present

## 2023-12-27 DIAGNOSIS — I2510 Atherosclerotic heart disease of native coronary artery without angina pectoris: Secondary | ICD-10-CM | POA: Insufficient documentation

## 2023-12-27 DIAGNOSIS — R9431 Abnormal electrocardiogram [ECG] [EKG]: Secondary | ICD-10-CM | POA: Insufficient documentation

## 2023-12-27 MED ORDER — ASPIRIN 81 MG PO TBEC: 81.0000 mg | DELAYED_RELEASE_TABLET | Freq: Every day | ORAL | Status: AC

## 2023-12-27 NOTE — Patient Instructions (Signed)
Medication Instructions:  Please start Asprin 81 mg a day. Continue all other medications as listed.  *If you need a refill on your cardiac medications before your next appointment, please call your pharmacy*   Testing/Procedures:   Your cardiac CT will be scheduled at:   [facility] [address] [place], [place] [address] [phone]  Please arrive at the [facility] and Children's Entrance (Entrance C2) of [facility] 30 minutes prior to test start time. You can use the FREE valet parking offered at entrance C (encouraged to control the heart rate for the test)  Proceed to the [facility] Radiology Department (first floor) to check-in and test prep.  All radiology patients and guests should use entrance C2 at [facility], accessed from [facility], even though the hospital's physical address listed is [address].     Please follow these instructions carefully (unless otherwise directed):  An IV will be required for this test and Nitroglycerin will be given.  Hold all erectile dysfunction medications at least 3 days (72 hrs) prior to test. (Ie viagra, cialis, sildenafil, tadalafil, etc)   On the Night Before the Test: Be sure to Drink plenty of water. Do not consume any caffeinated/decaffeinated beverages or chocolate 12 hours prior to your test. Do not take any antihistamines 12 hours prior to your test.  On the Day of the Test: Drink plenty of water until 1 hour prior to the test. Do not eat any food 1 hour prior to test. You may take your regular medications prior to the test.  Take metoprolol (Lopressor) two hours prior to test. If you take Furosemide/Hydrochlorothiazide/Spironolactone/Chlorthalidone, please HOLD on the morning of the test. Patients who wear a continuous glucose monitor MUST remove the device prior to scanning.  After the Test: Drink plenty of water. After receiving IV contrast, you may experience  a mild flushed feeling. This is normal. On occasion, you may experience a mild rash up to 24 hours after the test. This is not dangerous. If this occurs, you can take Benadryl 25 mg, Zyrtec, Claritin, or Allegra and increase your fluid intake. (Patients taking Tikosyn should avoid Benadryl, and may take Zyrtec, Claritin, or Allegra) If you experience trouble breathing, this can be serious. If it is severe call 911 IMMEDIATELY. If it is mild, please call our office.  We will call to schedule your test 2-4 weeks out understanding that some insurance companies will need an authorization prior to the service being performed.   For more information and frequently asked questions, please visit our website : [email or web address]  For non-scheduling related questions, please contact the cardiac imaging nurse navigator should you have any questions/concerns: Cardiac Imaging Nurse Navigators Direct Office Dial: [phone]   For scheduling needs, including cancellations and rescheduling, please call [place], [phone].   Follow-Up: At [facility], you and your health needs are our priority.  As part of our continuing mission to provide you with exceptional heart care, we have created designated Provider Care Teams.  These Care Teams include your primary Cardiologist (physician) and Advanced Practice Providers (APPs -  Physician Assistants and Nurse Practitioners) who all work together to provide you with the care you need, when you need it.  We recommend signing up for the patient portal called "MyChart".  Sign up information is provided on this After Visit Summary.  MyChart is used to connect with patients for Virtual Visits (Telemedicine).  Patients are able to view lab/test results, encounter  notes, upcoming appointments, etc.  Non-urgent messages can be sent to your provider as well.   To learn more about what you can do with MyChart, go to [email or web address].    Your  next appointment:   Follow up will be based on results of the above testing.  Provider:   Dr Anne Fu

## 2023-12-27 NOTE — H&P (View-Only) (Signed)
 Cardiology Office Note:  .   Date:  12/27/2023  ID:  Tedra Coupe, DOB 12/06/1950, MRN [id number] PCP: Georgann Housekeeper, MD  [facility] Providers Cardiologist:  None     History of Present Illness: .   Amyr Sluder is a 73 y.o. male Discussed with the use of AI scribe   History of Present Illness   The patient is a 73 year old male with hyperlipidemia and hypertension who presents for evaluation of elevated coronary calcium. He is accompanied by his wife, Fannie Knee. He was referred by his internist for evaluation of elevated coronary calcium. Friends with Dr. Myrtis Ser.  He underwent a coronary calcium score test on October 12, 2023, which revealed a score of 637, placing him in the 73rd percentile. A prior echocardiogram in 2021 showed an ejection fraction of 65% with grade one diastolic dysfunction, mildly dilated left ventricular internal cavity size, trivial mitral regurgitation, mild tricuspid regurgitation, borderline dilated aortic root at 39 mm, and possible apical hypertrophy.  No chest pain, shortness of breath, or fainting spells. He feels slightly more exerted when climbing stairs at [employer] for [facility], attributing this to his age. He regularly exercises on a treadmill with minimal elevation and reports no significant changes in his exercise tolerance. His wife notes occasional swelling in one ankle.  He has a history of hyperlipidemia and is currently taking Crestor 20 mg daily, which he started about a month ago after the calcium score results were reviewed by his internist.  He also has a history of hypertension and is on a combination of amlodipine and benazepril 5/20 mg daily.  His father had bypass surgery in his sixties, and his mother had a pacemaker. His father lived to 21 years old.  He is not a smoker.           Studies Reviewed: Marland Kitchen   EKG Interpretation Date/Time:  Thursday December 27 2023 15:41:48 EST Ventricular Rate:  51 PR Interval:  102 QRS  Duration:  90 QT Interval:  448 QTC Calculation: 412 R Axis:   61  Text Interpretation: Sinus bradycardia with short PR ST & T wave abnormality, consider anterolateral ischemia When compared with ECG of 19-Sep-2012 09:31, ST no longer depressed in Inferior leads ST now depressed in Anterior leads T wave inversion no longer evident in Inferior leads Confirmed by Donato Schultz ([id number]) on 12/27/2023 3:45:38 PM    Results   RADIOLOGY Coronary calcium score: 637, 73rd percentile (10/12/2023)  DIAGNOSTIC Echocardiogram: Ejection fraction 65%, grade 1 diastolic dysfunction, mildly dilated left ventricular internal cavity size, trivial mitral regurgitation, mild tricuspid regurgitation, borderline dilated aortic root 39 mm, possible apical hypertrophy (2021)     Risk Assessment/Calculations:           Physical Exam:   VS:  BP (!) 144/70   Pulse (!) 52   Ht 6' (1.829 m)   Wt 204 lb 9.6 oz (92.8 kg)   SpO2 96%   BMI 27.75 kg/m    Wt Readings from Last 3 Encounters:  12/27/23 204 lb 9.6 oz (92.8 kg)  07/13/23 205 lb (93 kg)  12/15/22 203 lb 11.2 oz (92.4 kg)    GEN: Well nourished, well developed in no acute distress NECK: No JVD; No carotid bruits CARDIAC: RRR, no murmurs, no rubs, no gallops RESPIRATORY:  Clear to auscultation without rales, wheezing or rhonchi  ABDOMEN: Soft, non-tender, non-distended EXTREMITIES:  No edema; No deformity   ASSESSMENT AND PLAN: .    Assessment and Plan  Coronary Artery Disease Elevated coronary calcium score of 637 (73rd percentile) indicating significant coronary artery disease. No current symptoms of chest pain, dyspnea, or syncope. Family history of cardiovascular disease. Prior echocardiogram in 2021 showed ejection fraction of 65%, grade 1 diastolic dysfunction, mildly dilated left ventricular internal cavity size, trivial mitral regurgitation, mild tricuspid regurgitation, borderline dilated aortic root (39 mm), and possible apical  hypertrophy. Reports increased exertional symptoms when climbing stairs, possibly related to coronary artery disease. Discussed risks and benefits of further testing, including coronary CT scan with contrast to assess for high-risk features or blood flow limitations. Explained that a calcium score >400 typically warrants additional testing to rule out high-risk features. Discussed potential need for angiography or heart catheterization if significant blood flow limitations are found. - Order coronary CT scan with contrast. Will see if apical hypertrophy is present as well.  - Add low-dose aspirin (81 mg daily).  Hyperlipidemia Managed with Crestor (rosuvastatin) 20 mg daily. Statin therapy is appropriate to stabilize soft plaque and reduce myocardial infarction risk. Explained that statins primarily target soft plaque, which can rupture and cause myocardial infarctions. On statin therapy for about a month. - Continue Crestor (rosuvastatin) 20 mg daily.  Hypertension Managed with amlodipine/benazepril 5/20 mg daily. Blood pressure control is essential in managing overall cardiovascular risk. - Continue amlodipine/benazepril 5/20 mg daily.  General Health Maintenance Proactive about health screenings and understands the importance of cardiovascular health. No smoking history. Discussed importance of lifestyle modifications and regular follow-ups. - Encourage continued healthy eating and regular exercise.  Follow-up - Will follow up with study.              Signed, Donato Schultz, MD

## 2023-12-27 NOTE — Progress Notes (Signed)
Cardiology Office Note:  .   Date:  12/27/2023  ID:  Tedra Coupe, DOB 12/06/1950, MRN [id number] PCP: Georgann Housekeeper, MD  [facility] Providers Cardiologist:  None     History of Present Illness: .   Amyr Sluder is a 73 y.o. male Discussed with the use of AI scribe   History of Present Illness   The patient is a 73 year old male with hyperlipidemia and hypertension who presents for evaluation of elevated coronary calcium. He is accompanied by his wife, Fannie Knee. He was referred by his internist for evaluation of elevated coronary calcium. Friends with Dr. Myrtis Ser.  He underwent a coronary calcium score test on October 12, 2023, which revealed a score of 637, placing him in the 73rd percentile. A prior echocardiogram in 2021 showed an ejection fraction of 65% with grade one diastolic dysfunction, mildly dilated left ventricular internal cavity size, trivial mitral regurgitation, mild tricuspid regurgitation, borderline dilated aortic root at 39 mm, and possible apical hypertrophy.  No chest pain, shortness of breath, or fainting spells. He feels slightly more exerted when climbing stairs at [employer] for [facility], attributing this to his age. He regularly exercises on a treadmill with minimal elevation and reports no significant changes in his exercise tolerance. His wife notes occasional swelling in one ankle.  He has a history of hyperlipidemia and is currently taking Crestor 20 mg daily, which he started about a month ago after the calcium score results were reviewed by his internist.  He also has a history of hypertension and is on a combination of amlodipine and benazepril 5/20 mg daily.  His father had bypass surgery in his sixties, and his mother had a pacemaker. His father lived to 21 years old.  He is not a smoker.           Studies Reviewed: Marland Kitchen   EKG Interpretation Date/Time:  Thursday December 27 2023 15:41:48 EST Ventricular Rate:  51 PR Interval:  102 QRS  Duration:  90 QT Interval:  448 QTC Calculation: 412 R Axis:   61  Text Interpretation: Sinus bradycardia with short PR ST & T wave abnormality, consider anterolateral ischemia When compared with ECG of 19-Sep-2012 09:31, ST no longer depressed in Inferior leads ST now depressed in Anterior leads T wave inversion no longer evident in Inferior leads Confirmed by Donato Schultz ([id number]) on 12/27/2023 3:45:38 PM    Results   RADIOLOGY Coronary calcium score: 637, 73rd percentile (10/12/2023)  DIAGNOSTIC Echocardiogram: Ejection fraction 65%, grade 1 diastolic dysfunction, mildly dilated left ventricular internal cavity size, trivial mitral regurgitation, mild tricuspid regurgitation, borderline dilated aortic root 39 mm, possible apical hypertrophy (2021)     Risk Assessment/Calculations:           Physical Exam:   VS:  BP (!) 144/70   Pulse (!) 52   Ht 6' (1.829 m)   Wt 204 lb 9.6 oz (92.8 kg)   SpO2 96%   BMI 27.75 kg/m    Wt Readings from Last 3 Encounters:  12/27/23 204 lb 9.6 oz (92.8 kg)  07/13/23 205 lb (93 kg)  12/15/22 203 lb 11.2 oz (92.4 kg)    GEN: Well nourished, well developed in no acute distress NECK: No JVD; No carotid bruits CARDIAC: RRR, no murmurs, no rubs, no gallops RESPIRATORY:  Clear to auscultation without rales, wheezing or rhonchi  ABDOMEN: Soft, non-tender, non-distended EXTREMITIES:  No edema; No deformity   ASSESSMENT AND PLAN: .    Assessment and Plan  Coronary Artery Disease Elevated coronary calcium score of 637 (73rd percentile) indicating significant coronary artery disease. No current symptoms of chest pain, dyspnea, or syncope. Family history of cardiovascular disease. Prior echocardiogram in 2021 showed ejection fraction of 65%, grade 1 diastolic dysfunction, mildly dilated left ventricular internal cavity size, trivial mitral regurgitation, mild tricuspid regurgitation, borderline dilated aortic root (39 mm), and possible apical  hypertrophy. Reports increased exertional symptoms when climbing stairs, possibly related to coronary artery disease. Discussed risks and benefits of further testing, including coronary CT scan with contrast to assess for high-risk features or blood flow limitations. Explained that a calcium score >400 typically warrants additional testing to rule out high-risk features. Discussed potential need for angiography or heart catheterization if significant blood flow limitations are found. - Order coronary CT scan with contrast. Will see if apical hypertrophy is present as well.  - Add low-dose aspirin (81 mg daily).  Hyperlipidemia Managed with Crestor (rosuvastatin) 20 mg daily. Statin therapy is appropriate to stabilize soft plaque and reduce myocardial infarction risk. Explained that statins primarily target soft plaque, which can rupture and cause myocardial infarctions. On statin therapy for about a month. - Continue Crestor (rosuvastatin) 20 mg daily.  Hypertension Managed with amlodipine/benazepril 5/20 mg daily. Blood pressure control is essential in managing overall cardiovascular risk. - Continue amlodipine/benazepril 5/20 mg daily.  General Health Maintenance Proactive about health screenings and understands the importance of cardiovascular health. No smoking history. Discussed importance of lifestyle modifications and regular follow-ups. - Encourage continued healthy eating and regular exercise.  Follow-up - Will follow up with study.              Signed, Donato Schultz, MD

## 2024-01-08 ENCOUNTER — Encounter (HOSPITAL_COMMUNITY): Payer: Self-pay

## 2024-01-11 ENCOUNTER — Ambulatory Visit (HOSPITAL_COMMUNITY)
Admission: RE | Admit: 2024-01-11 | Discharge: 2024-01-11 | Disposition: A | Discharge status: Home / Self Care | Payer: Medicare Other | Source: Ambulatory Visit | Attending: Cardiology | Admitting: Cardiology

## 2024-01-11 ENCOUNTER — Encounter: Payer: Self-pay | Admitting: Cardiology

## 2024-01-11 DIAGNOSIS — R0602 Shortness of breath: Secondary | ICD-10-CM | POA: Insufficient documentation

## 2024-01-11 DIAGNOSIS — R9431 Abnormal electrocardiogram [ECG] [EKG]: Secondary | ICD-10-CM | POA: Insufficient documentation

## 2024-01-11 LAB — POCT I-STAT CREATININE: Creatinine, Ser: 1.2 mg/dL (ref 0.61–1.24)

## 2024-01-11 MED ORDER — IOHEXOL 350 MG/ML SOLN
95.0000 mL | Freq: Once | INTRAVENOUS | Status: AC | PRN
  Administered 2024-01-11: 95 mL via INTRAVENOUS

## 2024-01-11 MED ORDER — NITROGLYCERIN 0.4 MG SL SUBL
0.8000 mg | SUBLINGUAL_TABLET | Freq: Once | SUBLINGUAL | Status: AC
  Administered 2024-01-11: 0.8 mg via SUBLINGUAL

## 2024-01-11 MED ORDER — NITROGLYCERIN 0.4 MG SL SUBL
SUBLINGUAL_TABLET | SUBLINGUAL | Status: AC
  Filled 2024-01-11: qty 2

## 2024-01-12 ENCOUNTER — Encounter: Payer: Self-pay | Admitting: Cardiology

## 2024-01-14 ENCOUNTER — Other Ambulatory Visit: Payer: Self-pay | Admitting: *Deleted

## 2024-01-14 ENCOUNTER — Telehealth: Payer: Self-pay | Admitting: Cardiology

## 2024-01-14 DIAGNOSIS — Z01812 Encounter for preprocedural laboratory examination: Secondary | ICD-10-CM

## 2024-01-14 DIAGNOSIS — R0602 Shortness of breath: Secondary | ICD-10-CM

## 2024-01-14 DIAGNOSIS — R079 Chest pain, unspecified: Secondary | ICD-10-CM

## 2024-01-14 NOTE — Telephone Encounter (Signed)
Spoke with pt who is aware of need for cardiac cath.  He prefers Thursday if possible.  Pt has been scheduled for left heart cath on 2/20 at 9 am with Dr Excell Seltzer.  He is aware to report to his closest [facility] for his blood work.  Orders placed for CBC/BMP.  Instructions to be sent to pt via MyChart.

## 2024-01-14 NOTE — Telephone Encounter (Signed)
Dr Anne Fu called and spoke with pt in detail, regarding the cardiac cath procedure.  All questions presented during the call by patient was answered at that time.

## 2024-01-14 NOTE — Telephone Encounter (Signed)
  Wife is calling to see about scheduling Brandon Goodman cath procedure. Wife states that is you cannot get Brandon Goodman on the phone to please call her at [phone].

## 2024-01-15 LAB — CBC
Hematocrit: 44.6 % (ref 37.5–51.0)
Hemoglobin: 15.3 g/dL (ref 13.0–17.7)
MCH: 31.7 pg (ref 26.6–33.0)
MCHC: 34.3 g/dL (ref 31.5–35.7)
MCV: 92 fL (ref 79–97)
Platelets: 131 10*3/uL — ABNORMAL LOW (ref 150–450)
RBC: 4.83 x10E6/uL (ref 4.14–5.80)
RDW: 11.6 % (ref 11.6–15.4)
WBC: 5.7 10*3/uL (ref 3.4–10.8)

## 2024-01-15 LAB — BASIC METABOLIC PANEL
BUN/Creatinine Ratio: 16 (ref 10–24)
BUN: 20 mg/dL (ref 8–27)
CO2: 25 mmol/L (ref 20–29)
Calcium: 9.8 mg/dL (ref 8.6–10.2)
Chloride: 100 mmol/L (ref 96–106)
Creatinine, Ser: 1.23 mg/dL (ref 0.76–1.27)
Glucose: 94 mg/dL (ref 70–99)
Potassium: 5.1 mmol/L (ref 3.5–5.2)
Sodium: 137 mmol/L (ref 134–144)
eGFR: 62 mL/min/{1.73_m2} (ref >59)

## 2024-01-16 ENCOUNTER — Other Ambulatory Visit (HOSPITAL_COMMUNITY): Payer: Self-pay | Admitting: Emergency Medicine

## 2024-01-16 ENCOUNTER — Ambulatory Visit (HOSPITAL_COMMUNITY)
Admission: RE | Admit: 2024-01-16 | Discharge: 2024-01-16 | Disposition: A | Discharge status: Home / Self Care | Payer: Medicare Other | Source: Ambulatory Visit | Attending: Cardiology | Admitting: Cardiology

## 2024-01-16 ENCOUNTER — Telehealth: Payer: Self-pay | Admitting: Cardiology

## 2024-01-16 DIAGNOSIS — R931 Abnormal findings on diagnostic imaging of heart and coronary circulation: Secondary | ICD-10-CM

## 2024-01-16 NOTE — Telephone Encounter (Signed)
Patient is calling because he is scheduled to have a cath procedure tomorrow. Patient stated he had a mole removed recently and had to have stitches. Patient stated the stitches were removed this morning, but Dr. Irene Limbo had notice the area was a little infected. Patient stated he is having to start an antibiotic today and wanted to make sure that would not interfere with his procedure. Please advise.

## 2024-01-16 NOTE — Telephone Encounter (Signed)
Spoke with pt and advised OK to take antibiotic for skin infection.

## 2024-01-17 ENCOUNTER — Encounter: Payer: Self-pay | Admitting: Cardiology

## 2024-01-17 ENCOUNTER — Other Ambulatory Visit: Payer: Self-pay

## 2024-01-17 ENCOUNTER — Encounter (HOSPITAL_COMMUNITY)
Admission: RE | Disposition: A | Discharge status: Home / Self Care | Payer: Self-pay | Source: Ambulatory Visit | Attending: Cardiovascular Disease

## 2024-01-17 ENCOUNTER — Ambulatory Visit (HOSPITAL_COMMUNITY)
Admission: RE | Admit: 2024-01-17 | Discharge: 2024-01-17 | Disposition: A | Discharge status: Home / Self Care | Payer: Medicare Other | Source: Ambulatory Visit | Attending: Cardiovascular Disease | Admitting: Cardiovascular Disease

## 2024-01-17 ENCOUNTER — Encounter (HOSPITAL_COMMUNITY): Payer: Self-pay | Admitting: Cardiovascular Disease

## 2024-01-17 DIAGNOSIS — I10 Essential (primary) hypertension: Secondary | ICD-10-CM | POA: Diagnosis not present

## 2024-01-17 DIAGNOSIS — Z8249 Family history of ischemic heart disease and other diseases of the circulatory system: Secondary | ICD-10-CM | POA: Diagnosis not present

## 2024-01-17 DIAGNOSIS — Z79899 Other long term (current) drug therapy: Secondary | ICD-10-CM | POA: Diagnosis not present

## 2024-01-17 DIAGNOSIS — E785 Hyperlipidemia, unspecified: Secondary | ICD-10-CM | POA: Diagnosis not present

## 2024-01-17 DIAGNOSIS — Z7982 Long term (current) use of aspirin: Secondary | ICD-10-CM | POA: Insufficient documentation

## 2024-01-17 DIAGNOSIS — R931 Abnormal findings on diagnostic imaging of heart and coronary circulation: Secondary | ICD-10-CM | POA: Diagnosis present

## 2024-01-17 DIAGNOSIS — I2510 Atherosclerotic heart disease of native coronary artery without angina pectoris: Secondary | ICD-10-CM | POA: Diagnosis not present

## 2024-01-17 HISTORY — PX: CORONARY PRESSURE/FFR STUDY: CATH118243

## 2024-01-17 HISTORY — PX: CORONARY ANGIOGRAPHY: CATH118303

## 2024-01-17 LAB — POCT ACTIVATED CLOTTING TIME: Activated Clotting Time: 250 s

## 2024-01-17 SURGERY — CORONARY ANGIOGRAPHY (CATH LAB)
Anesthesia: LOCAL

## 2024-01-17 MED ORDER — LIDOCAINE HCL (PF) 1 % IJ SOLN
INTRAMUSCULAR | Status: AC
  Filled 2024-01-17: qty 30

## 2024-01-17 MED ORDER — VERAPAMIL HCL 2.5 MG/ML IV SOLN
INTRAVENOUS | Status: AC
  Filled 2024-01-17: qty 2

## 2024-01-17 MED ORDER — MIDAZOLAM HCL 2 MG/2ML IJ SOLN
INTRAMUSCULAR | Status: DC | PRN
  Administered 2024-01-17: 2 mg via INTRAVENOUS

## 2024-01-17 MED ORDER — ASPIRIN 81 MG PO CHEW: 81.0000 mg | CHEWABLE_TABLET | ORAL | Status: DC

## 2024-01-17 MED ORDER — SODIUM CHLORIDE 0.9% FLUSH: 3.0000 mL | INTRAVENOUS | Status: DC | PRN

## 2024-01-17 MED ORDER — ACETAMINOPHEN 325 MG PO TABS: 650.0000 mg | ORAL_TABLET | ORAL | Status: DC | PRN

## 2024-01-17 MED ORDER — FENTANYL CITRATE (PF) 100 MCG/2ML IJ SOLN
INTRAMUSCULAR | Status: DC | PRN
  Administered 2024-01-17: 25 ug via INTRAVENOUS

## 2024-01-17 MED ORDER — HYDRALAZINE HCL 20 MG/ML IJ SOLN: 10.0000 mg | INTRAMUSCULAR | Status: DC | PRN

## 2024-01-17 MED ORDER — LABETALOL HCL 5 MG/ML IV SOLN: 10.0000 mg | INTRAVENOUS | Status: DC | PRN

## 2024-01-17 MED ORDER — LIDOCAINE HCL (PF) 1 % IJ SOLN
INTRAMUSCULAR | Status: DC | PRN
  Administered 2024-01-17: 5 mL

## 2024-01-17 MED ORDER — VERAPAMIL HCL 2.5 MG/ML IV SOLN
INTRAVENOUS | Status: DC | PRN
  Administered 2024-01-17: 10 mL via INTRA_ARTERIAL

## 2024-01-17 MED ORDER — HEPARIN SODIUM (PORCINE) 1000 UNIT/ML IJ SOLN
INTRAMUSCULAR | Status: DC | PRN
  Administered 2024-01-17: 5000 [IU] via INTRAVENOUS
  Administered 2024-01-17: 3000 [IU] via INTRAVENOUS

## 2024-01-17 MED ORDER — FENTANYL CITRATE (PF) 100 MCG/2ML IJ SOLN
INTRAMUSCULAR | Status: AC
Start: 2024-01-17 — End: ?
  Filled 2024-01-17: qty 2

## 2024-01-17 MED ORDER — ONDANSETRON HCL 4 MG/2ML IJ SOLN: 4.0000 mg | Freq: Four times a day (QID) | INTRAMUSCULAR | Status: DC | PRN

## 2024-01-17 MED ORDER — MIDAZOLAM HCL 2 MG/2ML IJ SOLN
INTRAMUSCULAR | Status: AC
  Filled 2024-01-17: qty 2

## 2024-01-17 MED ORDER — HEPARIN (PORCINE) IN NACL 2000-0.9 UNIT/L-% IV SOLN
INTRAVENOUS | Status: DC | PRN
  Administered 2024-01-17: 1000 mL

## 2024-01-17 MED ORDER — IOHEXOL 350 MG/ML SOLN
INTRAVENOUS | Status: DC | PRN
  Administered 2024-01-17: 100 mL

## 2024-01-17 MED ORDER — SODIUM CHLORIDE 0.9% FLUSH: 3.0000 mL | Freq: Two times a day (BID) | INTRAVENOUS | Status: DC

## 2024-01-17 MED ORDER — HEPARIN SODIUM (PORCINE) 1000 UNIT/ML IJ SOLN
INTRAMUSCULAR | Status: AC
  Filled 2024-01-17: qty 10

## 2024-01-17 MED ORDER — SODIUM CHLORIDE 0.9 % IV SOLN: 250.0000 mL | INTRAVENOUS | Status: DC | PRN

## 2024-01-17 MED ORDER — SODIUM CHLORIDE 0.9 % WEIGHT BASED INFUSION: 1.0000 mL/kg/h | INTRAVENOUS | Status: DC

## 2024-01-17 MED ORDER — SODIUM CHLORIDE 0.9 % WEIGHT BASED INFUSION: 3.0000 mL/kg/h | INTRAVENOUS | Status: AC

## 2024-01-17 SURGICAL SUPPLY — 14 items
CATH 5FR JL3.5 JR4 ANG PIG MP (CATHETERS) IMPLANT
CATH INFINITI 5FR AL1 (CATHETERS) IMPLANT
CATH LAUNCHER 5F AL1 (CATHETERS) IMPLANT
CATHETER LAUNCHER 5F AL1 (CATHETERS) ×1
COVER PRB 48X5XTLSCP FOLD TPE (BAG) IMPLANT
DEVICE RAD COMP TR BAND LRG (VASCULAR PRODUCTS) IMPLANT
GLIDESHEATH SLEND SS 6F .021 (SHEATH) IMPLANT
GUIDEWIRE INQWIRE 1.5J.035X260 (WIRE) IMPLANT
GUIDEWIRE PRESSURE X 175 (WIRE) IMPLANT
INQWIRE 1.5J .035X260CM (WIRE) ×1
KIT ESSENTIALS PG (KITS) IMPLANT
PACK CARDIAC CATHETERIZATION (CUSTOM PROCEDURE TRAY) ×1 IMPLANT
SET ATX-X65L (MISCELLANEOUS) IMPLANT
TUBING CIL FLEX 10 FLL-RA (TUBING) IMPLANT

## 2024-01-17 NOTE — Interval H&P Note (Signed)
History and Physical Interval Note:  01/17/2024 8:08 AM  Brandon Goodman  has presented today for surgery, with the diagnosis of abnormal ct - chest pain.  The various methods of treatment have been discussed with the patient and family. After consideration of risks, benefits and other options for treatment, the patient has consented to  Procedure(s): LEFT HEART CATH AND CORONARY ANGIOGRAPHY (N/A) as a surgical intervention.  The patient's history has been reviewed, patient examined, no change in status, stable for surgery.  I have reviewed the patient's chart and labs.  Questions were answered to the patient's satisfaction.     [name]

## 2024-01-17 NOTE — Discharge Instructions (Signed)

## 2024-01-18 ENCOUNTER — Other Ambulatory Visit: Payer: Self-pay | Admitting: *Deleted

## 2024-01-18 ENCOUNTER — Encounter: Payer: Self-pay | Admitting: Cardiology

## 2024-01-18 ENCOUNTER — Telehealth: Payer: Self-pay | Admitting: Cardiology

## 2024-01-18 DIAGNOSIS — Z79899 Other long term (current) drug therapy: Secondary | ICD-10-CM

## 2024-01-18 DIAGNOSIS — E785 Hyperlipidemia, unspecified: Secondary | ICD-10-CM

## 2024-01-18 NOTE — Telephone Encounter (Signed)
Lipid/ALT ordered and released for [facility].  Darl Pikes (wife) aware.

## 2024-01-18 NOTE — Telephone Encounter (Signed)
Patient's wife states patient has been scheduled for 4/23 at 7:30 AM with [facility]. They scheduled through the website because patient prefers to have an appointment scheduled rather than walking in. Please place orders and contact wife/patient to confirm.

## 2024-03-19 LAB — ALT: ALT: 23 IU/L (ref 0–44)

## 2024-03-19 LAB — LIPID PANEL
Chol/HDL Ratio: 2.8 ratio (ref 0.0–5.0)
Cholesterol, Total: 109 mg/dL (ref 100–199)
HDL: 39 mg/dL — ABNORMAL LOW (ref >39)
LDL Chol Calc (NIH): 51 mg/dL (ref 0–99)
Triglycerides: 100 mg/dL (ref 0–149)
VLDL Cholesterol Cal: 19 mg/dL (ref 5–40)

## 2024-03-25 ENCOUNTER — Encounter: Payer: Self-pay | Admitting: Cardiology

## 2024-06-19 ENCOUNTER — Encounter: Payer: Self-pay | Admitting: Cardiology

## 2024-10-09 ENCOUNTER — Other Ambulatory Visit: Payer: Self-pay | Admitting: Urology

## 2024-10-09 DIAGNOSIS — C61 Malignant neoplasm of prostate: Secondary | ICD-10-CM

## 2024-12-08 ENCOUNTER — Ambulatory Visit
Admission: RE | Admit: 2024-12-08 | Discharge: 2024-12-08 | Disposition: A | Source: Ambulatory Visit | Attending: Urology

## 2024-12-08 DIAGNOSIS — C61 Malignant neoplasm of prostate: Secondary | ICD-10-CM

## 2024-12-08 MED ORDER — GADOPICLENOL 0.5 MMOL/ML IV SOLN
10.0000 mL | Freq: Once | INTRAVENOUS | Status: AC | PRN
  Administered 2024-12-08: 10 mL via INTRAVENOUS
